# Patient Record
Sex: Female | Born: 1980 | Race: White | Hispanic: No | Marital: Married | Smoking: Never smoker
Health system: Southern US, Community
[De-identification: ages and names within clinical notes are randomized; demographics above are authoritative.]

## PROBLEM LIST (undated history)

## (undated) DIAGNOSIS — K589 Irritable bowel syndrome without diarrhea: Secondary | ICD-10-CM

## (undated) DIAGNOSIS — F4310 Post-traumatic stress disorder, unspecified: Secondary | ICD-10-CM

## (undated) DIAGNOSIS — F419 Anxiety disorder, unspecified: Secondary | ICD-10-CM

## (undated) DIAGNOSIS — J45909 Unspecified asthma, uncomplicated: Secondary | ICD-10-CM

## (undated) HISTORY — DX: Anxiety disorder, unspecified: F41.9

## (undated) HISTORY — DX: Post-traumatic stress disorder, unspecified: F43.10

## (undated) HISTORY — PX: TONSILLECTOMY: SUR1361

## (undated) HISTORY — DX: Irritable bowel syndrome, unspecified: K58.9

## (undated) HISTORY — PX: KNEE SURGERY: SHX244

## (undated) HISTORY — PX: BREAST SURGERY: SHX581

---

## 2013-03-29 ENCOUNTER — Emergency Department (HOSPITAL_COMMUNITY)
Admission: EM | Admit: 2013-03-29 | Discharge: 2013-03-29 | Disposition: A | Discharge status: Home / Self Care | Payer: BC Managed Care – PPO | Attending: Emergency Medicine | Admitting: Emergency Medicine

## 2013-03-29 ENCOUNTER — Encounter (HOSPITAL_COMMUNITY): Payer: Self-pay | Admitting: Emergency Medicine

## 2013-03-29 DIAGNOSIS — Z23 Encounter for immunization: Secondary | ICD-10-CM | POA: Insufficient documentation

## 2013-03-29 DIAGNOSIS — J45909 Unspecified asthma, uncomplicated: Secondary | ICD-10-CM | POA: Insufficient documentation

## 2013-03-29 DIAGNOSIS — Z203 Contact with and (suspected) exposure to rabies: Secondary | ICD-10-CM

## 2013-03-29 DIAGNOSIS — Z79899 Other long term (current) drug therapy: Secondary | ICD-10-CM | POA: Insufficient documentation

## 2013-03-29 HISTORY — DX: Unspecified asthma, uncomplicated: J45.909

## 2013-03-29 MED ORDER — RABIES VACCINE, PCEC IM SUSR
1.0000 mL | Freq: Once | INTRAMUSCULAR | Status: AC
  Administered 2013-03-29: 1 mL via INTRAMUSCULAR
  Filled 2013-03-29: qty 1

## 2013-03-29 MED ORDER — RABIES IMMUNE GLOBULIN 150 UNIT/ML IM INJ
20.0000 [IU]/kg | INJECTION | Freq: Once | INTRAMUSCULAR | Status: AC
  Administered 2013-03-29: 1875 [IU] via INTRAMUSCULAR
  Filled 2013-03-29: qty 12.5

## 2013-03-29 NOTE — ED Provider Notes (Signed)
CSN: [id number]     Arrival date & time 03/29/13  1850 History  This chart was scribed for non-physician practitioner, Dierdre Forth, PA-C,working with Glynn Octave, MD, by Karle Plumber, ED Scribe.  This patient was seen in room TR06C/TR06C and the patient's care was started at 8:01 PM.  Chief Complaint  Patient presents with  . Rabies Injection   The history is provided by the patient and medical records. No language interpreter was used.   HPI Comments:  Cassandra Baxter is a 32 y.o. female who presents to the Emergency Department complaining of being exposed to a bat. Pt states she went to visit her parents and is 99% sure there was a bat in the house. Pt states she was not bitten and did not touch the bat. Pt reports that she is going out of the country traveling to [place] from 04/08/13-04/18/13 and is concerned about being in town for the remaining vaccinations. Pt denies confusion, seizures, abdominal pain or fever.   Past Medical History  Diagnosis Date  . Asthma    Past Surgical History  Procedure Laterality Date  . Knee surgery     No family history on file. History  Substance Use Topics  . Smoking status: Never Smoker   . Smokeless tobacco: Not on file  . Alcohol Use: No   OB History   Grav Para Term Preterm Abortions TAB SAB Ect Mult Living                 Review of Systems  Constitutional: Negative for fever, diaphoresis, appetite change, fatigue and unexpected weight change.  HENT: Negative for mouth sores.   Eyes: Negative for visual disturbance.  Respiratory: Negative for cough, chest tightness, shortness of breath and wheezing.   Cardiovascular: Negative for chest pain.  Gastrointestinal: Negative for nausea, vomiting, abdominal pain, diarrhea and constipation.  Endocrine: Negative for polydipsia, polyphagia and polyuria.  Genitourinary: Negative for dysuria, urgency, frequency and hematuria.  Musculoskeletal: Negative for back pain and neck  stiffness.  Skin: Negative for rash.  Allergic/Immunologic: Negative for immunocompromised state.  Neurological: Negative for syncope, light-headedness and headaches.  Hematological: Does not bruise/bleed easily.  Psychiatric/Behavioral: Negative for sleep disturbance. The patient is not nervous/anxious.   All other systems reviewed and are negative.    Allergies  Review of patient's allergies indicates no known allergies.  Home Medications   Current Outpatient Rx  Name  Route  Sig  Dispense  Refill  . acetaminophen (TYLENOL) 500 MG tablet   Oral   Take 1,000 mg by mouth 3 (three) times daily as needed (pain).         Marland Kitchen albuterol (PROVENTIL HFA;VENTOLIN HFA) 108 (90 BASE) MCG/ACT inhaler   Inhalation   Inhale 2 puffs into the lungs daily as needed (asthma symptoms).         . ALPRAZolam (XANAX) 0.5 MG tablet   Oral   Take 0.5 mg by mouth at bedtime.         Marland Kitchen CALCIUM PO   Oral   Take 1 tablet by mouth daily.         . celecoxib (CELEBREX) 100 MG capsule   Oral   Take 100 mg by mouth daily.         . Cyanocobalamin (VITAMIN B-12 PO)   Oral   Take 1 tablet by mouth daily.         Marland Kitchen FOLIC ACID PO   Oral   Take 1 tablet by mouth daily.         Marland Kitchen  Glucosamine HCl (GLUCOSAMINE PO)   Oral   Take 1 tablet by mouth daily.         Marland Kitchen MAGNESIUM PO   Oral   Take 1 tablet by mouth daily. otc         . norethindrone-ethinyl estradiol (JUNEL FE,GILDESS FE,LOESTRIN FE) 1-20 MG-MCG tablet   Oral   Take 1 tablet by mouth daily.         Marland Kitchen POTASSIUM PO   Oral   Take 1 tablet by mouth daily. otc         . PRESCRIPTION MEDICATION   Topical   Apply 1 application topically 3 (three) times daily as needed (pain). Cream compounded at [facility] [id number]):  Ketoprofen 15%, baclofen 2%, cyclobenzaprine 2%, gabapentin 10%, lidocaine 2%         . sertraline (ZOLOFT) 50 MG tablet   Oral   Take 150 mg by mouth at bedtime.         . traMADol  (ULTRAM) 50 MG tablet   Oral   Take 50 mg by mouth daily as needed (pain).          . TURMERIC CURCUMIN PO   Oral   Take 1 capsule by mouth daily.          Triage Vitals: BP 128/72  Pulse 63  Temp(Src) 97.6 F (36.4 C) (Oral)  Resp 20  SpO2 100%  LMP 03/08/2013 Physical Exam  Nursing note and vitals reviewed. Constitutional: She is oriented to person, place, and time. She appears well-developed and well-nourished. No distress.  Awake, alert, nontoxic appearance  HENT:  Head: Normocephalic and atraumatic.  Mouth/Throat: Oropharynx is clear and moist. No oropharyngeal exudate.  Eyes: Conjunctivae are normal. No scleral icterus.  Neck: Normal range of motion. Neck supple.  Cardiovascular: Normal rate, regular rhythm, normal heart sounds and intact distal pulses.   No murmur heard. Pulmonary/Chest: Effort normal and breath sounds normal. No respiratory distress. She has no wheezes.  Abdominal: Soft. Bowel sounds are normal. She exhibits no mass. There is no tenderness. There is no rebound and no guarding.  Musculoskeletal: Normal range of motion. She exhibits no edema.  Neurological: She is alert and oriented to person, place, and time. No cranial nerve deficit. She exhibits normal muscle tone. Coordination normal.  Speech is clear and goal oriented Moves extremities without ataxia  Skin: Skin is warm and dry. No rash noted. She is not diaphoretic. No erythema.  Psychiatric: She has a normal mood and affect. Her behavior is normal.    ED Course  Procedures (including critical care time) DIAGNOSTIC STUDIES: Oxygen Saturation is 100% on RA, normal by my interpretation.   COORDINATION OF CARE: 8:15 PM- Will administer rabies vaccine and immunoglobulin prior to discharge. Pt verbalizes understanding and agrees to plan.  Medications  rabies vaccine (RABAVERT) injection 1 mL (not administered)  rabies immune globulin (HYPERAB) injection 1,875 Units (not administered)     Labs Review Labs Reviewed - No data to display Imaging Review No results found.  EKG Interpretation   None       MDM   1. Need for post exposure prophylaxis for rabies      [place] presents with concerns about a possible rabies exposure.  Pt is at very low risk for contracting rabies, but the risk vs benefit of the vaccine and immunoglobulin have been discussed with the patient.  They are electing to proceed with the administration of the vaccine and the immunoglobulin.  Pt  is hemodynamically stable and neurovascularly intact. No concern for acute rabies at this time.  Him in followup with [place] urgent care for further administrations of immunoglobulin.  It has been determined that no acute conditions requiring further emergency intervention are present at this time. The patient/guardian have been advised of the diagnosis and plan. We have discussed signs and symptoms that warrant return to the ED, such as changes or worsening in symptoms.   Vital signs are stable at discharge.   BP 128/72  Pulse 63  Temp(Src) 97.6 F (36.4 C) (Oral)  Resp 20  Wt 208 lb (94.348 kg)  SpO2 100%  LMP 03/08/2013  Patient/guardian has voiced understanding and agreed to follow-up with the PCP or specialist.   I personally performed the services described in this documentation, which was scribed in my presence. The recorded information has been reviewed and is accurate.    Dahlia Client Nakema Fake, PA-C 03/29/13 2129  Dierdre Forth, PA-C 03/29/13 2130

## 2013-03-29 NOTE — ED Notes (Signed)
Pt st's she was at her mothers house on Thanksgiving and there was a bat in the house.  St's after seeing it they could not locate it again but was told to come and get Rabies injection

## 2013-03-30 NOTE — ED Provider Notes (Signed)
Medical screening examination/treatment/procedure(s) were performed by non-physician practitioner and as supervising physician I was immediately available for consultation/collaboration.  EKG Interpretation   None         [name], MD 03/30/13 0104 

## 2013-04-01 ENCOUNTER — Emergency Department (INDEPENDENT_AMBULATORY_CARE_PROVIDER_SITE_OTHER)
Admission: EM | Admit: 2013-04-01 | Discharge: 2013-04-01 | Disposition: A | Discharge status: Home / Self Care | Payer: BC Managed Care – PPO | Source: Home / Self Care

## 2013-04-01 ENCOUNTER — Encounter (HOSPITAL_COMMUNITY): Payer: Self-pay | Admitting: Emergency Medicine

## 2013-04-01 ENCOUNTER — Telehealth (HOSPITAL_COMMUNITY): Payer: Self-pay

## 2013-04-01 DIAGNOSIS — Z203 Contact with and (suspected) exposure to rabies: Secondary | ICD-10-CM

## 2013-04-01 MED ORDER — RABIES VACCINE, PCEC IM SUSR
1.0000 mL | Freq: Once | INTRAMUSCULAR | Status: AC
  Administered 2013-04-01: 1 mL via INTRAMUSCULAR

## 2013-04-01 MED ORDER — RABIES VACCINE, PCEC IM SUSR
INTRAMUSCULAR | Status: AC
  Filled 2013-04-01: qty 1

## 2013-04-01 NOTE — ED Notes (Signed)
Patient in department for rabies injection 

## 2013-04-05 ENCOUNTER — Emergency Department (INDEPENDENT_AMBULATORY_CARE_PROVIDER_SITE_OTHER)
Admission: EM | Admit: 2013-04-05 | Discharge: 2013-04-05 | Disposition: A | Discharge status: Home / Self Care | Payer: BC Managed Care – PPO | Source: Home / Self Care

## 2013-04-05 ENCOUNTER — Encounter (HOSPITAL_COMMUNITY): Payer: Self-pay | Admitting: Emergency Medicine

## 2013-04-05 DIAGNOSIS — Z203 Contact with and (suspected) exposure to rabies: Secondary | ICD-10-CM

## 2013-04-05 MED ORDER — RABIES VACCINE, PCEC IM SUSR
INTRAMUSCULAR | Status: AC
  Filled 2013-04-05: qty 1

## 2013-04-05 MED ORDER — RABIES VACCINE, PCEC IM SUSR
1.0000 mL | Freq: Once | INTRAMUSCULAR | Status: AC
  Administered 2013-04-05: 1 mL via INTRAMUSCULAR

## 2013-04-05 NOTE — ED Notes (Signed)
Pt  Here  For  The  Next  Series  Of  Rabies  Injections  Voices  No   Complaints

## 2013-04-05 NOTE — ED Notes (Signed)
Pt   Cassandra Baxter  Be  In [place]  When the  Next     Of  His  Rabies   Injections  Are  Due            Dr  Denyse Amass  Advised          And      Advised  To  Get the  Last  When  She  Gets  Back

## 2013-04-19 ENCOUNTER — Encounter (HOSPITAL_COMMUNITY): Payer: Self-pay | Admitting: Emergency Medicine

## 2013-04-19 ENCOUNTER — Emergency Department (INDEPENDENT_AMBULATORY_CARE_PROVIDER_SITE_OTHER)
Admission: EM | Admit: 2013-04-19 | Discharge: 2013-04-19 | Disposition: A | Discharge status: Home / Self Care | Payer: BC Managed Care – PPO | Source: Home / Self Care

## 2013-04-19 DIAGNOSIS — Z203 Contact with and (suspected) exposure to rabies: Secondary | ICD-10-CM

## 2013-04-19 MED ORDER — RABIES VACCINE, PCEC IM SUSR
1.0000 mL | Freq: Once | INTRAMUSCULAR | Status: AC
  Administered 2013-04-19: 1 mL via INTRAMUSCULAR

## 2013-04-19 MED ORDER — RABIES VACCINE, PCEC IM SUSR
INTRAMUSCULAR | Status: AC
  Filled 2013-04-19: qty 1

## 2013-04-19 NOTE — ED Notes (Signed)
Here for final shot in series

## 2015-07-30 DIAGNOSIS — J3081 Allergic rhinitis due to animal (cat) (dog) hair and dander: Secondary | ICD-10-CM | POA: Diagnosis not present

## 2015-07-30 DIAGNOSIS — J301 Allergic rhinitis due to pollen: Secondary | ICD-10-CM | POA: Diagnosis not present

## 2015-07-30 DIAGNOSIS — J302 Other seasonal allergic rhinitis: Secondary | ICD-10-CM | POA: Diagnosis not present

## 2015-07-30 DIAGNOSIS — J4520 Mild intermittent asthma, uncomplicated: Secondary | ICD-10-CM | POA: Diagnosis not present

## 2015-08-01 DIAGNOSIS — F410 Panic disorder [episodic paroxysmal anxiety] without agoraphobia: Secondary | ICD-10-CM | POA: Diagnosis not present

## 2015-08-06 DIAGNOSIS — F4310 Post-traumatic stress disorder, unspecified: Secondary | ICD-10-CM | POA: Diagnosis not present

## 2015-08-07 DIAGNOSIS — J302 Other seasonal allergic rhinitis: Secondary | ICD-10-CM | POA: Diagnosis not present

## 2015-08-07 DIAGNOSIS — J3081 Allergic rhinitis due to animal (cat) (dog) hair and dander: Secondary | ICD-10-CM | POA: Diagnosis not present

## 2015-08-07 DIAGNOSIS — J4520 Mild intermittent asthma, uncomplicated: Secondary | ICD-10-CM | POA: Diagnosis not present

## 2015-08-07 DIAGNOSIS — J301 Allergic rhinitis due to pollen: Secondary | ICD-10-CM | POA: Diagnosis not present

## 2015-08-09 DIAGNOSIS — F410 Panic disorder [episodic paroxysmal anxiety] without agoraphobia: Secondary | ICD-10-CM | POA: Diagnosis not present

## 2015-08-13 DIAGNOSIS — J4520 Mild intermittent asthma, uncomplicated: Secondary | ICD-10-CM | POA: Diagnosis not present

## 2015-08-13 DIAGNOSIS — J3081 Allergic rhinitis due to animal (cat) (dog) hair and dander: Secondary | ICD-10-CM | POA: Diagnosis not present

## 2015-08-13 DIAGNOSIS — J301 Allergic rhinitis due to pollen: Secondary | ICD-10-CM | POA: Diagnosis not present

## 2015-08-13 DIAGNOSIS — J302 Other seasonal allergic rhinitis: Secondary | ICD-10-CM | POA: Diagnosis not present

## 2015-08-15 DIAGNOSIS — M6281 Muscle weakness (generalized): Secondary | ICD-10-CM | POA: Diagnosis not present

## 2015-08-15 DIAGNOSIS — M545 Low back pain: Secondary | ICD-10-CM | POA: Diagnosis not present

## 2015-08-15 DIAGNOSIS — M25571 Pain in right ankle and joints of right foot: Secondary | ICD-10-CM | POA: Diagnosis not present

## 2015-08-17 DIAGNOSIS — F410 Panic disorder [episodic paroxysmal anxiety] without agoraphobia: Secondary | ICD-10-CM | POA: Diagnosis not present

## 2015-08-17 DIAGNOSIS — Z8349 Family history of other endocrine, nutritional and metabolic diseases: Secondary | ICD-10-CM | POA: Diagnosis not present

## 2015-08-17 DIAGNOSIS — Z01419 Encounter for gynecological examination (general) (routine) without abnormal findings: Secondary | ICD-10-CM | POA: Diagnosis not present

## 2015-08-17 DIAGNOSIS — F4310 Post-traumatic stress disorder, unspecified: Secondary | ICD-10-CM | POA: Diagnosis not present

## 2015-08-20 DIAGNOSIS — J302 Other seasonal allergic rhinitis: Secondary | ICD-10-CM | POA: Diagnosis not present

## 2015-08-20 DIAGNOSIS — J301 Allergic rhinitis due to pollen: Secondary | ICD-10-CM | POA: Diagnosis not present

## 2015-08-20 DIAGNOSIS — J4520 Mild intermittent asthma, uncomplicated: Secondary | ICD-10-CM | POA: Diagnosis not present

## 2015-08-20 DIAGNOSIS — J3081 Allergic rhinitis due to animal (cat) (dog) hair and dander: Secondary | ICD-10-CM | POA: Diagnosis not present

## 2015-08-21 DIAGNOSIS — F410 Panic disorder [episodic paroxysmal anxiety] without agoraphobia: Secondary | ICD-10-CM | POA: Diagnosis not present

## 2015-08-22 DIAGNOSIS — M545 Low back pain: Secondary | ICD-10-CM | POA: Diagnosis not present

## 2015-08-22 DIAGNOSIS — M25571 Pain in right ankle and joints of right foot: Secondary | ICD-10-CM | POA: Diagnosis not present

## 2015-08-22 DIAGNOSIS — M6281 Muscle weakness (generalized): Secondary | ICD-10-CM | POA: Diagnosis not present

## 2015-08-27 DIAGNOSIS — F410 Panic disorder [episodic paroxysmal anxiety] without agoraphobia: Secondary | ICD-10-CM | POA: Diagnosis not present

## 2015-08-28 DIAGNOSIS — J301 Allergic rhinitis due to pollen: Secondary | ICD-10-CM | POA: Diagnosis not present

## 2015-08-28 DIAGNOSIS — J4520 Mild intermittent asthma, uncomplicated: Secondary | ICD-10-CM | POA: Diagnosis not present

## 2015-08-28 DIAGNOSIS — J302 Other seasonal allergic rhinitis: Secondary | ICD-10-CM | POA: Diagnosis not present

## 2015-08-28 DIAGNOSIS — J3081 Allergic rhinitis due to animal (cat) (dog) hair and dander: Secondary | ICD-10-CM | POA: Diagnosis not present

## 2015-08-29 DIAGNOSIS — M25511 Pain in right shoulder: Secondary | ICD-10-CM | POA: Diagnosis not present

## 2015-08-30 DIAGNOSIS — M25571 Pain in right ankle and joints of right foot: Secondary | ICD-10-CM | POA: Diagnosis not present

## 2015-08-30 DIAGNOSIS — M545 Low back pain: Secondary | ICD-10-CM | POA: Diagnosis not present

## 2015-08-30 DIAGNOSIS — M5136 Other intervertebral disc degeneration, lumbar region: Secondary | ICD-10-CM | POA: Diagnosis not present

## 2015-08-30 DIAGNOSIS — M47816 Spondylosis without myelopathy or radiculopathy, lumbar region: Secondary | ICD-10-CM | POA: Diagnosis not present

## 2015-08-30 DIAGNOSIS — M6281 Muscle weakness (generalized): Secondary | ICD-10-CM | POA: Diagnosis not present

## 2015-08-31 DIAGNOSIS — F4310 Post-traumatic stress disorder, unspecified: Secondary | ICD-10-CM | POA: Diagnosis not present

## 2015-09-03 DIAGNOSIS — F410 Panic disorder [episodic paroxysmal anxiety] without agoraphobia: Secondary | ICD-10-CM | POA: Diagnosis not present

## 2015-09-04 DIAGNOSIS — J301 Allergic rhinitis due to pollen: Secondary | ICD-10-CM | POA: Diagnosis not present

## 2015-09-04 DIAGNOSIS — J302 Other seasonal allergic rhinitis: Secondary | ICD-10-CM | POA: Diagnosis not present

## 2015-09-04 DIAGNOSIS — J4520 Mild intermittent asthma, uncomplicated: Secondary | ICD-10-CM | POA: Diagnosis not present

## 2015-09-04 DIAGNOSIS — J3081 Allergic rhinitis due to animal (cat) (dog) hair and dander: Secondary | ICD-10-CM | POA: Diagnosis not present

## 2015-09-06 DIAGNOSIS — M25571 Pain in right ankle and joints of right foot: Secondary | ICD-10-CM | POA: Diagnosis not present

## 2015-09-06 DIAGNOSIS — M545 Low back pain: Secondary | ICD-10-CM | POA: Diagnosis not present

## 2015-09-06 DIAGNOSIS — M6281 Muscle weakness (generalized): Secondary | ICD-10-CM | POA: Diagnosis not present

## 2015-09-10 DIAGNOSIS — F410 Panic disorder [episodic paroxysmal anxiety] without agoraphobia: Secondary | ICD-10-CM | POA: Diagnosis not present

## 2015-09-10 DIAGNOSIS — J3081 Allergic rhinitis due to animal (cat) (dog) hair and dander: Secondary | ICD-10-CM | POA: Diagnosis not present

## 2015-09-10 DIAGNOSIS — J302 Other seasonal allergic rhinitis: Secondary | ICD-10-CM | POA: Diagnosis not present

## 2015-09-10 DIAGNOSIS — J301 Allergic rhinitis due to pollen: Secondary | ICD-10-CM | POA: Diagnosis not present

## 2015-09-10 DIAGNOSIS — J4520 Mild intermittent asthma, uncomplicated: Secondary | ICD-10-CM | POA: Diagnosis not present

## 2015-09-11 DIAGNOSIS — Z8739 Personal history of other diseases of the musculoskeletal system and connective tissue: Secondary | ICD-10-CM | POA: Diagnosis not present

## 2015-09-11 DIAGNOSIS — Z317 Encounter for procreative management and counseling for gestational carrier: Secondary | ICD-10-CM | POA: Diagnosis not present

## 2015-09-17 DIAGNOSIS — J3081 Allergic rhinitis due to animal (cat) (dog) hair and dander: Secondary | ICD-10-CM | POA: Diagnosis not present

## 2015-09-17 DIAGNOSIS — J302 Other seasonal allergic rhinitis: Secondary | ICD-10-CM | POA: Diagnosis not present

## 2015-09-17 DIAGNOSIS — J4520 Mild intermittent asthma, uncomplicated: Secondary | ICD-10-CM | POA: Diagnosis not present

## 2015-09-17 DIAGNOSIS — J301 Allergic rhinitis due to pollen: Secondary | ICD-10-CM | POA: Diagnosis not present

## 2015-09-19 DIAGNOSIS — M6281 Muscle weakness (generalized): Secondary | ICD-10-CM | POA: Diagnosis not present

## 2015-09-19 DIAGNOSIS — M545 Low back pain: Secondary | ICD-10-CM | POA: Diagnosis not present

## 2015-09-19 DIAGNOSIS — M25571 Pain in right ankle and joints of right foot: Secondary | ICD-10-CM | POA: Diagnosis not present

## 2015-09-21 DIAGNOSIS — F410 Panic disorder [episodic paroxysmal anxiety] without agoraphobia: Secondary | ICD-10-CM | POA: Diagnosis not present

## 2015-09-25 DIAGNOSIS — J3081 Allergic rhinitis due to animal (cat) (dog) hair and dander: Secondary | ICD-10-CM | POA: Diagnosis not present

## 2015-09-25 DIAGNOSIS — L821 Other seborrheic keratosis: Secondary | ICD-10-CM | POA: Diagnosis not present

## 2015-09-25 DIAGNOSIS — D239 Other benign neoplasm of skin, unspecified: Secondary | ICD-10-CM | POA: Diagnosis not present

## 2015-09-25 DIAGNOSIS — J4520 Mild intermittent asthma, uncomplicated: Secondary | ICD-10-CM | POA: Diagnosis not present

## 2015-09-25 DIAGNOSIS — J302 Other seasonal allergic rhinitis: Secondary | ICD-10-CM | POA: Diagnosis not present

## 2015-09-25 DIAGNOSIS — J301 Allergic rhinitis due to pollen: Secondary | ICD-10-CM | POA: Diagnosis not present

## 2015-09-26 DIAGNOSIS — F410 Panic disorder [episodic paroxysmal anxiety] without agoraphobia: Secondary | ICD-10-CM | POA: Diagnosis not present

## 2015-10-01 DIAGNOSIS — J302 Other seasonal allergic rhinitis: Secondary | ICD-10-CM | POA: Diagnosis not present

## 2015-10-01 DIAGNOSIS — J3081 Allergic rhinitis due to animal (cat) (dog) hair and dander: Secondary | ICD-10-CM | POA: Diagnosis not present

## 2015-10-01 DIAGNOSIS — F410 Panic disorder [episodic paroxysmal anxiety] without agoraphobia: Secondary | ICD-10-CM | POA: Diagnosis not present

## 2015-10-01 DIAGNOSIS — J301 Allergic rhinitis due to pollen: Secondary | ICD-10-CM | POA: Diagnosis not present

## 2015-10-01 DIAGNOSIS — J4520 Mild intermittent asthma, uncomplicated: Secondary | ICD-10-CM | POA: Diagnosis not present

## 2015-10-04 DIAGNOSIS — F4310 Post-traumatic stress disorder, unspecified: Secondary | ICD-10-CM | POA: Diagnosis not present

## 2015-10-15 DIAGNOSIS — M25511 Pain in right shoulder: Secondary | ICD-10-CM | POA: Diagnosis not present

## 2015-10-15 DIAGNOSIS — J301 Allergic rhinitis due to pollen: Secondary | ICD-10-CM | POA: Diagnosis not present

## 2015-10-15 DIAGNOSIS — J302 Other seasonal allergic rhinitis: Secondary | ICD-10-CM | POA: Diagnosis not present

## 2015-10-15 DIAGNOSIS — J4520 Mild intermittent asthma, uncomplicated: Secondary | ICD-10-CM | POA: Diagnosis not present

## 2015-10-15 DIAGNOSIS — J3081 Allergic rhinitis due to animal (cat) (dog) hair and dander: Secondary | ICD-10-CM | POA: Diagnosis not present

## 2015-10-15 DIAGNOSIS — M6281 Muscle weakness (generalized): Secondary | ICD-10-CM | POA: Diagnosis not present

## 2015-10-17 DIAGNOSIS — F410 Panic disorder [episodic paroxysmal anxiety] without agoraphobia: Secondary | ICD-10-CM | POA: Diagnosis not present

## 2015-10-19 DIAGNOSIS — S0086XA Insect bite (nonvenomous) of other part of head, initial encounter: Secondary | ICD-10-CM | POA: Diagnosis not present

## 2015-10-19 DIAGNOSIS — W57XXXA Bitten or stung by nonvenomous insect and other nonvenomous arthropods, initial encounter: Secondary | ICD-10-CM | POA: Diagnosis not present

## 2015-11-02 DIAGNOSIS — F410 Panic disorder [episodic paroxysmal anxiety] without agoraphobia: Secondary | ICD-10-CM | POA: Diagnosis not present

## 2015-11-07 DIAGNOSIS — M545 Low back pain: Secondary | ICD-10-CM | POA: Diagnosis not present

## 2015-11-07 DIAGNOSIS — M5136 Other intervertebral disc degeneration, lumbar region: Secondary | ICD-10-CM | POA: Diagnosis not present

## 2015-11-08 DIAGNOSIS — M6281 Muscle weakness (generalized): Secondary | ICD-10-CM | POA: Diagnosis not present

## 2015-11-08 DIAGNOSIS — M25511 Pain in right shoulder: Secondary | ICD-10-CM | POA: Diagnosis not present

## 2015-11-09 DIAGNOSIS — F410 Panic disorder [episodic paroxysmal anxiety] without agoraphobia: Secondary | ICD-10-CM | POA: Diagnosis not present

## 2015-11-12 DIAGNOSIS — M25511 Pain in right shoulder: Secondary | ICD-10-CM | POA: Diagnosis not present

## 2015-11-12 DIAGNOSIS — M6281 Muscle weakness (generalized): Secondary | ICD-10-CM | POA: Diagnosis not present

## 2015-11-13 DIAGNOSIS — J4520 Mild intermittent asthma, uncomplicated: Secondary | ICD-10-CM | POA: Diagnosis not present

## 2015-11-13 DIAGNOSIS — J3081 Allergic rhinitis due to animal (cat) (dog) hair and dander: Secondary | ICD-10-CM | POA: Diagnosis not present

## 2015-11-13 DIAGNOSIS — J302 Other seasonal allergic rhinitis: Secondary | ICD-10-CM | POA: Diagnosis not present

## 2015-11-13 DIAGNOSIS — J301 Allergic rhinitis due to pollen: Secondary | ICD-10-CM | POA: Diagnosis not present

## 2015-11-16 DIAGNOSIS — F410 Panic disorder [episodic paroxysmal anxiety] without agoraphobia: Secondary | ICD-10-CM | POA: Diagnosis not present

## 2015-11-26 DIAGNOSIS — F410 Panic disorder [episodic paroxysmal anxiety] without agoraphobia: Secondary | ICD-10-CM | POA: Diagnosis not present

## 2015-11-27 DIAGNOSIS — F4310 Post-traumatic stress disorder, unspecified: Secondary | ICD-10-CM | POA: Diagnosis not present

## 2015-11-29 DIAGNOSIS — M25511 Pain in right shoulder: Secondary | ICD-10-CM | POA: Diagnosis not present

## 2015-11-29 DIAGNOSIS — M6281 Muscle weakness (generalized): Secondary | ICD-10-CM | POA: Diagnosis not present

## 2015-12-03 DIAGNOSIS — F410 Panic disorder [episodic paroxysmal anxiety] without agoraphobia: Secondary | ICD-10-CM | POA: Diagnosis not present

## 2015-12-04 DIAGNOSIS — J301 Allergic rhinitis due to pollen: Secondary | ICD-10-CM | POA: Diagnosis not present

## 2015-12-04 DIAGNOSIS — J4520 Mild intermittent asthma, uncomplicated: Secondary | ICD-10-CM | POA: Diagnosis not present

## 2015-12-04 DIAGNOSIS — J302 Other seasonal allergic rhinitis: Secondary | ICD-10-CM | POA: Diagnosis not present

## 2015-12-04 DIAGNOSIS — J3081 Allergic rhinitis due to animal (cat) (dog) hair and dander: Secondary | ICD-10-CM | POA: Diagnosis not present

## 2015-12-10 DIAGNOSIS — R942 Abnormal results of pulmonary function studies: Secondary | ICD-10-CM | POA: Diagnosis not present

## 2015-12-10 DIAGNOSIS — J301 Allergic rhinitis due to pollen: Secondary | ICD-10-CM | POA: Diagnosis not present

## 2015-12-10 DIAGNOSIS — J302 Other seasonal allergic rhinitis: Secondary | ICD-10-CM | POA: Diagnosis not present

## 2015-12-10 DIAGNOSIS — F410 Panic disorder [episodic paroxysmal anxiety] without agoraphobia: Secondary | ICD-10-CM | POA: Diagnosis not present

## 2015-12-10 DIAGNOSIS — J4520 Mild intermittent asthma, uncomplicated: Secondary | ICD-10-CM | POA: Diagnosis not present

## 2015-12-10 DIAGNOSIS — J3081 Allergic rhinitis due to animal (cat) (dog) hair and dander: Secondary | ICD-10-CM | POA: Diagnosis not present

## 2015-12-10 DIAGNOSIS — J4530 Mild persistent asthma, uncomplicated: Secondary | ICD-10-CM | POA: Diagnosis not present

## 2015-12-12 DIAGNOSIS — Z136 Encounter for screening for cardiovascular disorders: Secondary | ICD-10-CM | POA: Diagnosis not present

## 2015-12-12 DIAGNOSIS — Z23 Encounter for immunization: Secondary | ICD-10-CM | POA: Diagnosis not present

## 2015-12-12 DIAGNOSIS — Z0000 Encounter for general adult medical examination without abnormal findings: Secondary | ICD-10-CM | POA: Diagnosis not present

## 2015-12-13 DIAGNOSIS — M25511 Pain in right shoulder: Secondary | ICD-10-CM | POA: Diagnosis not present

## 2015-12-13 DIAGNOSIS — M6281 Muscle weakness (generalized): Secondary | ICD-10-CM | POA: Diagnosis not present

## 2015-12-17 DIAGNOSIS — F410 Panic disorder [episodic paroxysmal anxiety] without agoraphobia: Secondary | ICD-10-CM | POA: Diagnosis not present

## 2015-12-18 DIAGNOSIS — M5136 Other intervertebral disc degeneration, lumbar region: Secondary | ICD-10-CM | POA: Diagnosis not present

## 2015-12-18 DIAGNOSIS — M25511 Pain in right shoulder: Secondary | ICD-10-CM | POA: Diagnosis not present

## 2015-12-19 DIAGNOSIS — M6281 Muscle weakness (generalized): Secondary | ICD-10-CM | POA: Diagnosis not present

## 2015-12-19 DIAGNOSIS — M25511 Pain in right shoulder: Secondary | ICD-10-CM | POA: Diagnosis not present

## 2015-12-24 DIAGNOSIS — F410 Panic disorder [episodic paroxysmal anxiety] without agoraphobia: Secondary | ICD-10-CM | POA: Diagnosis not present

## 2015-12-25 DIAGNOSIS — F4310 Post-traumatic stress disorder, unspecified: Secondary | ICD-10-CM | POA: Diagnosis not present

## 2015-12-26 DIAGNOSIS — M25511 Pain in right shoulder: Secondary | ICD-10-CM | POA: Diagnosis not present

## 2015-12-27 DIAGNOSIS — M25511 Pain in right shoulder: Secondary | ICD-10-CM | POA: Diagnosis not present

## 2015-12-27 DIAGNOSIS — J3081 Allergic rhinitis due to animal (cat) (dog) hair and dander: Secondary | ICD-10-CM | POA: Diagnosis not present

## 2015-12-27 DIAGNOSIS — J301 Allergic rhinitis due to pollen: Secondary | ICD-10-CM | POA: Diagnosis not present

## 2015-12-27 DIAGNOSIS — J4520 Mild intermittent asthma, uncomplicated: Secondary | ICD-10-CM | POA: Diagnosis not present

## 2015-12-27 DIAGNOSIS — J302 Other seasonal allergic rhinitis: Secondary | ICD-10-CM | POA: Diagnosis not present

## 2015-12-27 DIAGNOSIS — M6281 Muscle weakness (generalized): Secondary | ICD-10-CM | POA: Diagnosis not present

## 2016-01-01 DIAGNOSIS — J301 Allergic rhinitis due to pollen: Secondary | ICD-10-CM | POA: Diagnosis not present

## 2016-01-01 DIAGNOSIS — F410 Panic disorder [episodic paroxysmal anxiety] without agoraphobia: Secondary | ICD-10-CM | POA: Diagnosis not present

## 2016-01-01 DIAGNOSIS — J302 Other seasonal allergic rhinitis: Secondary | ICD-10-CM | POA: Diagnosis not present

## 2016-01-01 DIAGNOSIS — J3081 Allergic rhinitis due to animal (cat) (dog) hair and dander: Secondary | ICD-10-CM | POA: Diagnosis not present

## 2016-01-01 DIAGNOSIS — J4520 Mild intermittent asthma, uncomplicated: Secondary | ICD-10-CM | POA: Diagnosis not present

## 2016-01-04 DIAGNOSIS — M25511 Pain in right shoulder: Secondary | ICD-10-CM | POA: Diagnosis not present

## 2016-01-04 DIAGNOSIS — M6281 Muscle weakness (generalized): Secondary | ICD-10-CM | POA: Diagnosis not present

## 2016-01-07 DIAGNOSIS — F410 Panic disorder [episodic paroxysmal anxiety] without agoraphobia: Secondary | ICD-10-CM | POA: Diagnosis not present

## 2016-01-08 DIAGNOSIS — J4520 Mild intermittent asthma, uncomplicated: Secondary | ICD-10-CM | POA: Diagnosis not present

## 2016-01-08 DIAGNOSIS — J302 Other seasonal allergic rhinitis: Secondary | ICD-10-CM | POA: Diagnosis not present

## 2016-01-08 DIAGNOSIS — J3081 Allergic rhinitis due to animal (cat) (dog) hair and dander: Secondary | ICD-10-CM | POA: Diagnosis not present

## 2016-01-08 DIAGNOSIS — J301 Allergic rhinitis due to pollen: Secondary | ICD-10-CM | POA: Diagnosis not present

## 2016-01-10 DIAGNOSIS — M6281 Muscle weakness (generalized): Secondary | ICD-10-CM | POA: Diagnosis not present

## 2016-01-10 DIAGNOSIS — M25511 Pain in right shoulder: Secondary | ICD-10-CM | POA: Diagnosis not present

## 2016-01-14 DIAGNOSIS — F410 Panic disorder [episodic paroxysmal anxiety] without agoraphobia: Secondary | ICD-10-CM | POA: Diagnosis not present

## 2016-01-15 DIAGNOSIS — J301 Allergic rhinitis due to pollen: Secondary | ICD-10-CM | POA: Diagnosis not present

## 2016-01-15 DIAGNOSIS — J4520 Mild intermittent asthma, uncomplicated: Secondary | ICD-10-CM | POA: Diagnosis not present

## 2016-01-15 DIAGNOSIS — J302 Other seasonal allergic rhinitis: Secondary | ICD-10-CM | POA: Diagnosis not present

## 2016-01-15 DIAGNOSIS — J3081 Allergic rhinitis due to animal (cat) (dog) hair and dander: Secondary | ICD-10-CM | POA: Diagnosis not present

## 2016-01-16 DIAGNOSIS — M25511 Pain in right shoulder: Secondary | ICD-10-CM | POA: Diagnosis not present

## 2016-01-16 DIAGNOSIS — M6281 Muscle weakness (generalized): Secondary | ICD-10-CM | POA: Diagnosis not present

## 2016-01-18 DIAGNOSIS — F4310 Post-traumatic stress disorder, unspecified: Secondary | ICD-10-CM | POA: Diagnosis not present

## 2016-01-21 DIAGNOSIS — Z91018 Allergy to other foods: Secondary | ICD-10-CM | POA: Diagnosis not present

## 2016-01-21 DIAGNOSIS — F410 Panic disorder [episodic paroxysmal anxiety] without agoraphobia: Secondary | ICD-10-CM | POA: Diagnosis not present

## 2016-01-22 DIAGNOSIS — J302 Other seasonal allergic rhinitis: Secondary | ICD-10-CM | POA: Diagnosis not present

## 2016-01-22 DIAGNOSIS — J301 Allergic rhinitis due to pollen: Secondary | ICD-10-CM | POA: Diagnosis not present

## 2016-01-22 DIAGNOSIS — J4520 Mild intermittent asthma, uncomplicated: Secondary | ICD-10-CM | POA: Diagnosis not present

## 2016-01-22 DIAGNOSIS — J3081 Allergic rhinitis due to animal (cat) (dog) hair and dander: Secondary | ICD-10-CM | POA: Diagnosis not present

## 2016-01-23 DIAGNOSIS — D72818 Other decreased white blood cell count: Secondary | ICD-10-CM | POA: Diagnosis not present

## 2016-01-24 DIAGNOSIS — M6281 Muscle weakness (generalized): Secondary | ICD-10-CM | POA: Diagnosis not present

## 2016-01-24 DIAGNOSIS — M25511 Pain in right shoulder: Secondary | ICD-10-CM | POA: Diagnosis not present

## 2016-01-25 DIAGNOSIS — K219 Gastro-esophageal reflux disease without esophagitis: Secondary | ICD-10-CM | POA: Diagnosis not present

## 2016-01-25 DIAGNOSIS — F411 Generalized anxiety disorder: Secondary | ICD-10-CM | POA: Diagnosis not present

## 2016-01-25 DIAGNOSIS — F4310 Post-traumatic stress disorder, unspecified: Secondary | ICD-10-CM | POA: Diagnosis not present

## 2016-01-25 DIAGNOSIS — F329 Major depressive disorder, single episode, unspecified: Secondary | ICD-10-CM | POA: Diagnosis not present

## 2016-01-25 DIAGNOSIS — Z23 Encounter for immunization: Secondary | ICD-10-CM | POA: Diagnosis not present

## 2016-01-28 DIAGNOSIS — J301 Allergic rhinitis due to pollen: Secondary | ICD-10-CM | POA: Diagnosis not present

## 2016-01-28 DIAGNOSIS — J302 Other seasonal allergic rhinitis: Secondary | ICD-10-CM | POA: Diagnosis not present

## 2016-01-28 DIAGNOSIS — J3081 Allergic rhinitis due to animal (cat) (dog) hair and dander: Secondary | ICD-10-CM | POA: Diagnosis not present

## 2016-01-28 DIAGNOSIS — J4520 Mild intermittent asthma, uncomplicated: Secondary | ICD-10-CM | POA: Diagnosis not present

## 2016-01-28 DIAGNOSIS — M6281 Muscle weakness (generalized): Secondary | ICD-10-CM | POA: Diagnosis not present

## 2016-01-28 DIAGNOSIS — M25511 Pain in right shoulder: Secondary | ICD-10-CM | POA: Diagnosis not present

## 2016-01-30 DIAGNOSIS — F410 Panic disorder [episodic paroxysmal anxiety] without agoraphobia: Secondary | ICD-10-CM | POA: Diagnosis not present

## 2016-02-04 DIAGNOSIS — F410 Panic disorder [episodic paroxysmal anxiety] without agoraphobia: Secondary | ICD-10-CM | POA: Diagnosis not present

## 2016-02-05 DIAGNOSIS — J301 Allergic rhinitis due to pollen: Secondary | ICD-10-CM | POA: Diagnosis not present

## 2016-02-05 DIAGNOSIS — J3081 Allergic rhinitis due to animal (cat) (dog) hair and dander: Secondary | ICD-10-CM | POA: Diagnosis not present

## 2016-02-05 DIAGNOSIS — J4520 Mild intermittent asthma, uncomplicated: Secondary | ICD-10-CM | POA: Diagnosis not present

## 2016-02-05 DIAGNOSIS — J302 Other seasonal allergic rhinitis: Secondary | ICD-10-CM | POA: Diagnosis not present

## 2016-02-06 DIAGNOSIS — M67911 Unspecified disorder of synovium and tendon, right shoulder: Secondary | ICD-10-CM | POA: Diagnosis not present

## 2016-02-12 DIAGNOSIS — J301 Allergic rhinitis due to pollen: Secondary | ICD-10-CM | POA: Diagnosis not present

## 2016-02-12 DIAGNOSIS — J3081 Allergic rhinitis due to animal (cat) (dog) hair and dander: Secondary | ICD-10-CM | POA: Diagnosis not present

## 2016-02-12 DIAGNOSIS — J302 Other seasonal allergic rhinitis: Secondary | ICD-10-CM | POA: Diagnosis not present

## 2016-02-12 DIAGNOSIS — Z23 Encounter for immunization: Secondary | ICD-10-CM | POA: Diagnosis not present

## 2016-02-12 DIAGNOSIS — J4520 Mild intermittent asthma, uncomplicated: Secondary | ICD-10-CM | POA: Diagnosis not present

## 2016-02-15 DIAGNOSIS — F410 Panic disorder [episodic paroxysmal anxiety] without agoraphobia: Secondary | ICD-10-CM | POA: Diagnosis not present

## 2016-02-15 DIAGNOSIS — M6281 Muscle weakness (generalized): Secondary | ICD-10-CM | POA: Diagnosis not present

## 2016-02-15 DIAGNOSIS — M25511 Pain in right shoulder: Secondary | ICD-10-CM | POA: Diagnosis not present

## 2016-02-18 DIAGNOSIS — J301 Allergic rhinitis due to pollen: Secondary | ICD-10-CM | POA: Diagnosis not present

## 2016-02-18 DIAGNOSIS — J3081 Allergic rhinitis due to animal (cat) (dog) hair and dander: Secondary | ICD-10-CM | POA: Diagnosis not present

## 2016-02-18 DIAGNOSIS — J302 Other seasonal allergic rhinitis: Secondary | ICD-10-CM | POA: Diagnosis not present

## 2016-02-18 DIAGNOSIS — J4520 Mild intermittent asthma, uncomplicated: Secondary | ICD-10-CM | POA: Diagnosis not present

## 2016-02-19 DIAGNOSIS — F410 Panic disorder [episodic paroxysmal anxiety] without agoraphobia: Secondary | ICD-10-CM | POA: Diagnosis not present

## 2016-02-20 DIAGNOSIS — M545 Low back pain: Secondary | ICD-10-CM | POA: Diagnosis not present

## 2016-02-20 DIAGNOSIS — M5136 Other intervertebral disc degeneration, lumbar region: Secondary | ICD-10-CM | POA: Diagnosis not present

## 2016-02-20 DIAGNOSIS — M47816 Spondylosis without myelopathy or radiculopathy, lumbar region: Secondary | ICD-10-CM | POA: Diagnosis not present

## 2016-02-21 DIAGNOSIS — F4310 Post-traumatic stress disorder, unspecified: Secondary | ICD-10-CM | POA: Diagnosis not present

## 2016-02-22 DIAGNOSIS — F410 Panic disorder [episodic paroxysmal anxiety] without agoraphobia: Secondary | ICD-10-CM | POA: Diagnosis not present

## 2016-02-25 DIAGNOSIS — Z9882 Breast implant status: Secondary | ICD-10-CM | POA: Diagnosis not present

## 2016-02-25 DIAGNOSIS — F410 Panic disorder [episodic paroxysmal anxiety] without agoraphobia: Secondary | ICD-10-CM | POA: Diagnosis not present

## 2016-02-25 DIAGNOSIS — D709 Neutropenia, unspecified: Secondary | ICD-10-CM | POA: Diagnosis not present

## 2016-02-25 DIAGNOSIS — G894 Chronic pain syndrome: Secondary | ICD-10-CM | POA: Diagnosis not present

## 2016-02-25 DIAGNOSIS — J3081 Allergic rhinitis due to animal (cat) (dog) hair and dander: Secondary | ICD-10-CM | POA: Diagnosis not present

## 2016-02-25 DIAGNOSIS — J4520 Mild intermittent asthma, uncomplicated: Secondary | ICD-10-CM | POA: Diagnosis not present

## 2016-02-25 DIAGNOSIS — J302 Other seasonal allergic rhinitis: Secondary | ICD-10-CM | POA: Diagnosis not present

## 2016-02-25 DIAGNOSIS — F4310 Post-traumatic stress disorder, unspecified: Secondary | ICD-10-CM | POA: Diagnosis not present

## 2016-02-25 DIAGNOSIS — J301 Allergic rhinitis due to pollen: Secondary | ICD-10-CM | POA: Diagnosis not present

## 2016-02-27 DIAGNOSIS — M25511 Pain in right shoulder: Secondary | ICD-10-CM | POA: Diagnosis not present

## 2016-02-27 DIAGNOSIS — M6281 Muscle weakness (generalized): Secondary | ICD-10-CM | POA: Diagnosis not present

## 2016-02-29 DIAGNOSIS — F4310 Post-traumatic stress disorder, unspecified: Secondary | ICD-10-CM | POA: Diagnosis not present

## 2016-03-03 DIAGNOSIS — J3081 Allergic rhinitis due to animal (cat) (dog) hair and dander: Secondary | ICD-10-CM | POA: Diagnosis not present

## 2016-03-03 DIAGNOSIS — F410 Panic disorder [episodic paroxysmal anxiety] without agoraphobia: Secondary | ICD-10-CM | POA: Diagnosis not present

## 2016-03-03 DIAGNOSIS — J302 Other seasonal allergic rhinitis: Secondary | ICD-10-CM | POA: Diagnosis not present

## 2016-03-03 DIAGNOSIS — J4520 Mild intermittent asthma, uncomplicated: Secondary | ICD-10-CM | POA: Diagnosis not present

## 2016-03-03 DIAGNOSIS — J301 Allergic rhinitis due to pollen: Secondary | ICD-10-CM | POA: Diagnosis not present

## 2016-03-10 DIAGNOSIS — F410 Panic disorder [episodic paroxysmal anxiety] without agoraphobia: Secondary | ICD-10-CM | POA: Diagnosis not present

## 2016-03-11 DIAGNOSIS — M2031 Hallux varus (acquired), right foot: Secondary | ICD-10-CM | POA: Diagnosis not present

## 2016-03-17 DIAGNOSIS — F410 Panic disorder [episodic paroxysmal anxiety] without agoraphobia: Secondary | ICD-10-CM | POA: Diagnosis not present

## 2016-03-24 DIAGNOSIS — F4310 Post-traumatic stress disorder, unspecified: Secondary | ICD-10-CM | POA: Diagnosis not present

## 2016-03-25 DIAGNOSIS — M6281 Muscle weakness (generalized): Secondary | ICD-10-CM | POA: Diagnosis not present

## 2016-03-25 DIAGNOSIS — M25511 Pain in right shoulder: Secondary | ICD-10-CM | POA: Diagnosis not present

## 2016-03-28 DIAGNOSIS — J4530 Mild persistent asthma, uncomplicated: Secondary | ICD-10-CM | POA: Diagnosis not present

## 2016-03-28 DIAGNOSIS — J309 Allergic rhinitis, unspecified: Secondary | ICD-10-CM | POA: Diagnosis not present

## 2016-03-28 DIAGNOSIS — J3081 Allergic rhinitis due to animal (cat) (dog) hair and dander: Secondary | ICD-10-CM | POA: Diagnosis not present

## 2016-03-28 DIAGNOSIS — J3089 Other allergic rhinitis: Secondary | ICD-10-CM | POA: Diagnosis not present

## 2016-03-28 DIAGNOSIS — J301 Allergic rhinitis due to pollen: Secondary | ICD-10-CM | POA: Diagnosis not present

## 2016-03-31 DIAGNOSIS — F4310 Post-traumatic stress disorder, unspecified: Secondary | ICD-10-CM | POA: Diagnosis not present

## 2016-03-31 DIAGNOSIS — M25372 Other instability, left ankle: Secondary | ICD-10-CM | POA: Diagnosis not present

## 2016-03-31 DIAGNOSIS — Q663 Other congenital varus deformities of feet: Secondary | ICD-10-CM | POA: Diagnosis not present

## 2016-04-02 DIAGNOSIS — F410 Panic disorder [episodic paroxysmal anxiety] without agoraphobia: Secondary | ICD-10-CM | POA: Diagnosis not present

## 2016-04-04 DIAGNOSIS — F410 Panic disorder [episodic paroxysmal anxiety] without agoraphobia: Secondary | ICD-10-CM | POA: Diagnosis not present

## 2016-04-07 DIAGNOSIS — F410 Panic disorder [episodic paroxysmal anxiety] without agoraphobia: Secondary | ICD-10-CM | POA: Diagnosis not present

## 2016-04-08 DIAGNOSIS — J301 Allergic rhinitis due to pollen: Secondary | ICD-10-CM | POA: Diagnosis not present

## 2016-04-09 DIAGNOSIS — J3081 Allergic rhinitis due to animal (cat) (dog) hair and dander: Secondary | ICD-10-CM | POA: Diagnosis not present

## 2016-04-09 DIAGNOSIS — F410 Panic disorder [episodic paroxysmal anxiety] without agoraphobia: Secondary | ICD-10-CM | POA: Diagnosis not present

## 2016-04-09 DIAGNOSIS — J3089 Other allergic rhinitis: Secondary | ICD-10-CM | POA: Diagnosis not present

## 2016-04-13 DIAGNOSIS — R220 Localized swelling, mass and lump, head: Secondary | ICD-10-CM | POA: Diagnosis not present

## 2016-04-13 DIAGNOSIS — L03011 Cellulitis of right finger: Secondary | ICD-10-CM | POA: Diagnosis not present

## 2016-04-16 DIAGNOSIS — F410 Panic disorder [episodic paroxysmal anxiety] without agoraphobia: Secondary | ICD-10-CM | POA: Diagnosis not present

## 2016-04-23 ENCOUNTER — Other Ambulatory Visit: Payer: Self-pay | Admitting: Allergy

## 2016-04-23 ENCOUNTER — Ambulatory Visit
Admission: RE | Admit: 2016-04-23 | Discharge: 2016-04-23 | Disposition: A | Discharge status: Home / Self Care | Payer: BLUE CROSS/BLUE SHIELD | Source: Ambulatory Visit | Attending: Allergy | Admitting: Allergy

## 2016-04-23 DIAGNOSIS — J45909 Unspecified asthma, uncomplicated: Secondary | ICD-10-CM | POA: Diagnosis not present

## 2016-04-23 DIAGNOSIS — J3081 Allergic rhinitis due to animal (cat) (dog) hair and dander: Secondary | ICD-10-CM | POA: Diagnosis not present

## 2016-04-23 DIAGNOSIS — J301 Allergic rhinitis due to pollen: Secondary | ICD-10-CM | POA: Diagnosis not present

## 2016-04-23 DIAGNOSIS — J4530 Mild persistent asthma, uncomplicated: Secondary | ICD-10-CM

## 2016-04-23 DIAGNOSIS — J3089 Other allergic rhinitis: Secondary | ICD-10-CM | POA: Diagnosis not present

## 2016-04-24 DIAGNOSIS — F4310 Post-traumatic stress disorder, unspecified: Secondary | ICD-10-CM | POA: Diagnosis not present

## 2016-04-29 DIAGNOSIS — D709 Neutropenia, unspecified: Secondary | ICD-10-CM | POA: Diagnosis not present

## 2016-04-29 DIAGNOSIS — F4310 Post-traumatic stress disorder, unspecified: Secondary | ICD-10-CM | POA: Diagnosis not present

## 2016-04-29 DIAGNOSIS — K589 Irritable bowel syndrome without diarrhea: Secondary | ICD-10-CM | POA: Diagnosis not present

## 2016-04-29 DIAGNOSIS — G894 Chronic pain syndrome: Secondary | ICD-10-CM | POA: Diagnosis not present

## 2016-04-30 DIAGNOSIS — F410 Panic disorder [episodic paroxysmal anxiety] without agoraphobia: Secondary | ICD-10-CM | POA: Diagnosis not present

## 2016-05-01 DIAGNOSIS — M25572 Pain in left ankle and joints of left foot: Secondary | ICD-10-CM | POA: Diagnosis not present

## 2016-05-01 DIAGNOSIS — M6281 Muscle weakness (generalized): Secondary | ICD-10-CM | POA: Diagnosis not present

## 2016-05-06 DIAGNOSIS — F410 Panic disorder [episodic paroxysmal anxiety] without agoraphobia: Secondary | ICD-10-CM | POA: Diagnosis not present

## 2016-05-16 DIAGNOSIS — F4310 Post-traumatic stress disorder, unspecified: Secondary | ICD-10-CM | POA: Diagnosis not present

## 2016-05-19 DIAGNOSIS — M25519 Pain in unspecified shoulder: Secondary | ICD-10-CM | POA: Diagnosis not present

## 2016-05-19 DIAGNOSIS — M545 Low back pain: Secondary | ICD-10-CM | POA: Diagnosis not present

## 2016-05-19 DIAGNOSIS — M25579 Pain in unspecified ankle and joints of unspecified foot: Secondary | ICD-10-CM | POA: Diagnosis not present

## 2016-05-19 DIAGNOSIS — Z79899 Other long term (current) drug therapy: Secondary | ICD-10-CM | POA: Diagnosis not present

## 2016-05-19 DIAGNOSIS — Z79891 Long term (current) use of opiate analgesic: Secondary | ICD-10-CM | POA: Diagnosis not present

## 2016-05-19 DIAGNOSIS — G894 Chronic pain syndrome: Secondary | ICD-10-CM | POA: Diagnosis not present

## 2016-05-20 DIAGNOSIS — F410 Panic disorder [episodic paroxysmal anxiety] without agoraphobia: Secondary | ICD-10-CM | POA: Diagnosis not present

## 2016-05-22 DIAGNOSIS — M47816 Spondylosis without myelopathy or radiculopathy, lumbar region: Secondary | ICD-10-CM | POA: Diagnosis not present

## 2016-05-22 DIAGNOSIS — M5136 Other intervertebral disc degeneration, lumbar region: Secondary | ICD-10-CM | POA: Diagnosis not present

## 2016-05-22 DIAGNOSIS — M545 Low back pain: Secondary | ICD-10-CM | POA: Diagnosis not present

## 2016-05-26 DIAGNOSIS — F410 Panic disorder [episodic paroxysmal anxiety] without agoraphobia: Secondary | ICD-10-CM | POA: Diagnosis not present

## 2016-06-05 DIAGNOSIS — F410 Panic disorder [episodic paroxysmal anxiety] without agoraphobia: Secondary | ICD-10-CM | POA: Diagnosis not present

## 2016-06-09 DIAGNOSIS — F410 Panic disorder [episodic paroxysmal anxiety] without agoraphobia: Secondary | ICD-10-CM | POA: Diagnosis not present

## 2016-06-10 DIAGNOSIS — J3081 Allergic rhinitis due to animal (cat) (dog) hair and dander: Secondary | ICD-10-CM | POA: Diagnosis not present

## 2016-06-10 DIAGNOSIS — J301 Allergic rhinitis due to pollen: Secondary | ICD-10-CM | POA: Diagnosis not present

## 2016-06-10 DIAGNOSIS — J3089 Other allergic rhinitis: Secondary | ICD-10-CM | POA: Diagnosis not present

## 2016-06-11 DIAGNOSIS — M6281 Muscle weakness (generalized): Secondary | ICD-10-CM | POA: Diagnosis not present

## 2016-06-11 DIAGNOSIS — M25572 Pain in left ankle and joints of left foot: Secondary | ICD-10-CM | POA: Diagnosis not present

## 2016-06-16 DIAGNOSIS — F410 Panic disorder [episodic paroxysmal anxiety] without agoraphobia: Secondary | ICD-10-CM | POA: Diagnosis not present

## 2016-06-17 DIAGNOSIS — M25572 Pain in left ankle and joints of left foot: Secondary | ICD-10-CM | POA: Diagnosis not present

## 2016-06-17 DIAGNOSIS — M6281 Muscle weakness (generalized): Secondary | ICD-10-CM | POA: Diagnosis not present

## 2016-06-23 DIAGNOSIS — F410 Panic disorder [episodic paroxysmal anxiety] without agoraphobia: Secondary | ICD-10-CM | POA: Diagnosis not present

## 2016-06-25 DIAGNOSIS — M6281 Muscle weakness (generalized): Secondary | ICD-10-CM | POA: Diagnosis not present

## 2016-06-25 DIAGNOSIS — M5136 Other intervertebral disc degeneration, lumbar region: Secondary | ICD-10-CM | POA: Diagnosis not present

## 2016-06-25 DIAGNOSIS — M545 Low back pain: Secondary | ICD-10-CM | POA: Diagnosis not present

## 2016-06-25 DIAGNOSIS — M25572 Pain in left ankle and joints of left foot: Secondary | ICD-10-CM | POA: Diagnosis not present

## 2016-06-25 DIAGNOSIS — M791 Myalgia: Secondary | ICD-10-CM | POA: Diagnosis not present

## 2016-06-25 DIAGNOSIS — M47816 Spondylosis without myelopathy or radiculopathy, lumbar region: Secondary | ICD-10-CM | POA: Diagnosis not present

## 2016-06-27 DIAGNOSIS — K580 Irritable bowel syndrome with diarrhea: Secondary | ICD-10-CM | POA: Diagnosis not present

## 2016-06-27 DIAGNOSIS — D709 Neutropenia, unspecified: Secondary | ICD-10-CM | POA: Diagnosis not present

## 2016-06-27 DIAGNOSIS — J040 Acute laryngitis: Secondary | ICD-10-CM | POA: Diagnosis not present

## 2016-06-27 DIAGNOSIS — G894 Chronic pain syndrome: Secondary | ICD-10-CM | POA: Diagnosis not present

## 2016-06-30 DIAGNOSIS — J3081 Allergic rhinitis due to animal (cat) (dog) hair and dander: Secondary | ICD-10-CM | POA: Diagnosis not present

## 2016-06-30 DIAGNOSIS — J3089 Other allergic rhinitis: Secondary | ICD-10-CM | POA: Diagnosis not present

## 2016-06-30 DIAGNOSIS — J301 Allergic rhinitis due to pollen: Secondary | ICD-10-CM | POA: Diagnosis not present

## 2016-07-01 DIAGNOSIS — F410 Panic disorder [episodic paroxysmal anxiety] without agoraphobia: Secondary | ICD-10-CM | POA: Diagnosis not present

## 2016-07-02 DIAGNOSIS — F4310 Post-traumatic stress disorder, unspecified: Secondary | ICD-10-CM | POA: Diagnosis not present

## 2016-07-08 DIAGNOSIS — J3081 Allergic rhinitis due to animal (cat) (dog) hair and dander: Secondary | ICD-10-CM | POA: Diagnosis not present

## 2016-07-08 DIAGNOSIS — J301 Allergic rhinitis due to pollen: Secondary | ICD-10-CM | POA: Diagnosis not present

## 2016-07-08 DIAGNOSIS — J3089 Other allergic rhinitis: Secondary | ICD-10-CM | POA: Diagnosis not present

## 2016-07-11 DIAGNOSIS — F4310 Post-traumatic stress disorder, unspecified: Secondary | ICD-10-CM | POA: Diagnosis not present

## 2016-07-14 DIAGNOSIS — J301 Allergic rhinitis due to pollen: Secondary | ICD-10-CM | POA: Diagnosis not present

## 2016-07-14 DIAGNOSIS — J3089 Other allergic rhinitis: Secondary | ICD-10-CM | POA: Diagnosis not present

## 2016-07-14 DIAGNOSIS — J3081 Allergic rhinitis due to animal (cat) (dog) hair and dander: Secondary | ICD-10-CM | POA: Diagnosis not present

## 2016-07-15 DIAGNOSIS — F410 Panic disorder [episodic paroxysmal anxiety] without agoraphobia: Secondary | ICD-10-CM | POA: Diagnosis not present

## 2016-07-16 ENCOUNTER — Other Ambulatory Visit: Payer: Self-pay | Admitting: Physician Assistant

## 2016-07-16 DIAGNOSIS — Q663 Other congenital varus deformities of feet, unspecified foot: Secondary | ICD-10-CM

## 2016-07-17 DIAGNOSIS — Z3041 Encounter for surveillance of contraceptive pills: Secondary | ICD-10-CM | POA: Diagnosis not present

## 2016-07-17 DIAGNOSIS — R61 Generalized hyperhidrosis: Secondary | ICD-10-CM | POA: Diagnosis not present

## 2016-07-17 DIAGNOSIS — Z3009 Encounter for other general counseling and advice on contraception: Secondary | ICD-10-CM | POA: Diagnosis not present

## 2016-07-17 DIAGNOSIS — Z1329 Encounter for screening for other suspected endocrine disorder: Secondary | ICD-10-CM | POA: Diagnosis not present

## 2016-07-21 DIAGNOSIS — J3081 Allergic rhinitis due to animal (cat) (dog) hair and dander: Secondary | ICD-10-CM | POA: Diagnosis not present

## 2016-07-21 DIAGNOSIS — J301 Allergic rhinitis due to pollen: Secondary | ICD-10-CM | POA: Diagnosis not present

## 2016-07-21 DIAGNOSIS — J3089 Other allergic rhinitis: Secondary | ICD-10-CM | POA: Diagnosis not present

## 2016-07-21 DIAGNOSIS — F410 Panic disorder [episodic paroxysmal anxiety] without agoraphobia: Secondary | ICD-10-CM | POA: Diagnosis not present

## 2016-07-28 ENCOUNTER — Ambulatory Visit
Admission: RE | Admit: 2016-07-28 | Discharge: 2016-07-28 | Disposition: A | Discharge status: Home / Self Care | Payer: BLUE CROSS/BLUE SHIELD | Source: Ambulatory Visit | Attending: Physician Assistant | Admitting: Physician Assistant

## 2016-07-28 DIAGNOSIS — Q663 Other congenital varus deformities of feet, unspecified foot: Secondary | ICD-10-CM

## 2016-07-28 DIAGNOSIS — M25572 Pain in left ankle and joints of left foot: Secondary | ICD-10-CM | POA: Diagnosis not present

## 2016-07-29 DIAGNOSIS — M79645 Pain in left finger(s): Secondary | ICD-10-CM | POA: Diagnosis not present

## 2016-07-29 DIAGNOSIS — L03019 Cellulitis of unspecified finger: Secondary | ICD-10-CM | POA: Diagnosis not present

## 2016-07-30 DIAGNOSIS — J301 Allergic rhinitis due to pollen: Secondary | ICD-10-CM | POA: Diagnosis not present

## 2016-07-30 DIAGNOSIS — J3081 Allergic rhinitis due to animal (cat) (dog) hair and dander: Secondary | ICD-10-CM | POA: Diagnosis not present

## 2016-07-30 DIAGNOSIS — J3089 Other allergic rhinitis: Secondary | ICD-10-CM | POA: Diagnosis not present

## 2016-07-31 DIAGNOSIS — Q663 Other congenital varus deformities of feet: Secondary | ICD-10-CM | POA: Diagnosis not present

## 2016-07-31 DIAGNOSIS — F4310 Post-traumatic stress disorder, unspecified: Secondary | ICD-10-CM | POA: Diagnosis not present

## 2016-08-04 DIAGNOSIS — J3089 Other allergic rhinitis: Secondary | ICD-10-CM | POA: Diagnosis not present

## 2016-08-04 DIAGNOSIS — J3081 Allergic rhinitis due to animal (cat) (dog) hair and dander: Secondary | ICD-10-CM | POA: Diagnosis not present

## 2016-08-04 DIAGNOSIS — J301 Allergic rhinitis due to pollen: Secondary | ICD-10-CM | POA: Diagnosis not present

## 2016-08-05 DIAGNOSIS — K219 Gastro-esophageal reflux disease without esophagitis: Secondary | ICD-10-CM | POA: Diagnosis not present

## 2016-08-05 DIAGNOSIS — J342 Deviated nasal septum: Secondary | ICD-10-CM | POA: Diagnosis not present

## 2016-08-05 DIAGNOSIS — J343 Hypertrophy of nasal turbinates: Secondary | ICD-10-CM | POA: Diagnosis not present

## 2016-08-05 DIAGNOSIS — J302 Other seasonal allergic rhinitis: Secondary | ICD-10-CM | POA: Diagnosis not present

## 2016-08-05 DIAGNOSIS — F410 Panic disorder [episodic paroxysmal anxiety] without agoraphobia: Secondary | ICD-10-CM | POA: Diagnosis not present

## 2016-08-08 DIAGNOSIS — J3089 Other allergic rhinitis: Secondary | ICD-10-CM | POA: Diagnosis not present

## 2016-08-08 DIAGNOSIS — J301 Allergic rhinitis due to pollen: Secondary | ICD-10-CM | POA: Diagnosis not present

## 2016-08-08 DIAGNOSIS — J3081 Allergic rhinitis due to animal (cat) (dog) hair and dander: Secondary | ICD-10-CM | POA: Diagnosis not present

## 2016-08-11 DIAGNOSIS — J3089 Other allergic rhinitis: Secondary | ICD-10-CM | POA: Diagnosis not present

## 2016-08-11 DIAGNOSIS — J301 Allergic rhinitis due to pollen: Secondary | ICD-10-CM | POA: Diagnosis not present

## 2016-08-11 DIAGNOSIS — J3081 Allergic rhinitis due to animal (cat) (dog) hair and dander: Secondary | ICD-10-CM | POA: Diagnosis not present

## 2016-08-11 DIAGNOSIS — F410 Panic disorder [episodic paroxysmal anxiety] without agoraphobia: Secondary | ICD-10-CM | POA: Diagnosis not present

## 2016-08-13 DIAGNOSIS — J3081 Allergic rhinitis due to animal (cat) (dog) hair and dander: Secondary | ICD-10-CM | POA: Diagnosis not present

## 2016-08-13 DIAGNOSIS — J301 Allergic rhinitis due to pollen: Secondary | ICD-10-CM | POA: Diagnosis not present

## 2016-08-13 DIAGNOSIS — J3089 Other allergic rhinitis: Secondary | ICD-10-CM | POA: Diagnosis not present

## 2016-08-14 DIAGNOSIS — F410 Panic disorder [episodic paroxysmal anxiety] without agoraphobia: Secondary | ICD-10-CM | POA: Diagnosis not present

## 2016-08-15 DIAGNOSIS — M25572 Pain in left ankle and joints of left foot: Secondary | ICD-10-CM | POA: Diagnosis not present

## 2016-08-15 DIAGNOSIS — M25561 Pain in right knee: Secondary | ICD-10-CM | POA: Diagnosis not present

## 2016-08-15 DIAGNOSIS — M25571 Pain in right ankle and joints of right foot: Secondary | ICD-10-CM | POA: Diagnosis not present

## 2016-08-15 DIAGNOSIS — M25562 Pain in left knee: Secondary | ICD-10-CM | POA: Diagnosis not present

## 2016-08-18 DIAGNOSIS — F410 Panic disorder [episodic paroxysmal anxiety] without agoraphobia: Secondary | ICD-10-CM | POA: Diagnosis not present

## 2016-08-18 DIAGNOSIS — J3081 Allergic rhinitis due to animal (cat) (dog) hair and dander: Secondary | ICD-10-CM | POA: Diagnosis not present

## 2016-08-18 DIAGNOSIS — J3089 Other allergic rhinitis: Secondary | ICD-10-CM | POA: Diagnosis not present

## 2016-08-18 DIAGNOSIS — J301 Allergic rhinitis due to pollen: Secondary | ICD-10-CM | POA: Diagnosis not present

## 2016-08-22 DIAGNOSIS — F410 Panic disorder [episodic paroxysmal anxiety] without agoraphobia: Secondary | ICD-10-CM | POA: Diagnosis not present

## 2016-08-22 DIAGNOSIS — J301 Allergic rhinitis due to pollen: Secondary | ICD-10-CM | POA: Diagnosis not present

## 2016-08-22 DIAGNOSIS — J3089 Other allergic rhinitis: Secondary | ICD-10-CM | POA: Diagnosis not present

## 2016-08-22 DIAGNOSIS — J3081 Allergic rhinitis due to animal (cat) (dog) hair and dander: Secondary | ICD-10-CM | POA: Diagnosis not present

## 2016-08-25 DIAGNOSIS — F410 Panic disorder [episodic paroxysmal anxiety] without agoraphobia: Secondary | ICD-10-CM | POA: Diagnosis not present

## 2016-08-25 DIAGNOSIS — J3089 Other allergic rhinitis: Secondary | ICD-10-CM | POA: Diagnosis not present

## 2016-08-25 DIAGNOSIS — J3081 Allergic rhinitis due to animal (cat) (dog) hair and dander: Secondary | ICD-10-CM | POA: Diagnosis not present

## 2016-08-25 DIAGNOSIS — J301 Allergic rhinitis due to pollen: Secondary | ICD-10-CM | POA: Diagnosis not present

## 2016-08-26 DIAGNOSIS — M25571 Pain in right ankle and joints of right foot: Secondary | ICD-10-CM | POA: Diagnosis not present

## 2016-08-26 DIAGNOSIS — M25562 Pain in left knee: Secondary | ICD-10-CM | POA: Diagnosis not present

## 2016-08-26 DIAGNOSIS — M25572 Pain in left ankle and joints of left foot: Secondary | ICD-10-CM | POA: Diagnosis not present

## 2016-08-26 DIAGNOSIS — M25561 Pain in right knee: Secondary | ICD-10-CM | POA: Diagnosis not present

## 2016-09-01 DIAGNOSIS — F4310 Post-traumatic stress disorder, unspecified: Secondary | ICD-10-CM | POA: Diagnosis not present

## 2016-09-03 DIAGNOSIS — J3081 Allergic rhinitis due to animal (cat) (dog) hair and dander: Secondary | ICD-10-CM | POA: Diagnosis not present

## 2016-09-03 DIAGNOSIS — F410 Panic disorder [episodic paroxysmal anxiety] without agoraphobia: Secondary | ICD-10-CM | POA: Diagnosis not present

## 2016-09-03 DIAGNOSIS — J3089 Other allergic rhinitis: Secondary | ICD-10-CM | POA: Diagnosis not present

## 2016-09-03 DIAGNOSIS — J301 Allergic rhinitis due to pollen: Secondary | ICD-10-CM | POA: Diagnosis not present

## 2016-09-05 DIAGNOSIS — D709 Neutropenia, unspecified: Secondary | ICD-10-CM | POA: Diagnosis not present

## 2016-09-05 DIAGNOSIS — K580 Irritable bowel syndrome with diarrhea: Secondary | ICD-10-CM | POA: Diagnosis not present

## 2016-09-05 DIAGNOSIS — F4310 Post-traumatic stress disorder, unspecified: Secondary | ICD-10-CM | POA: Diagnosis not present

## 2016-09-05 DIAGNOSIS — F410 Panic disorder [episodic paroxysmal anxiety] without agoraphobia: Secondary | ICD-10-CM | POA: Diagnosis not present

## 2016-09-05 DIAGNOSIS — G894 Chronic pain syndrome: Secondary | ICD-10-CM | POA: Diagnosis not present

## 2016-09-05 DIAGNOSIS — G4762 Sleep related leg cramps: Secondary | ICD-10-CM | POA: Diagnosis not present

## 2016-09-08 DIAGNOSIS — J301 Allergic rhinitis due to pollen: Secondary | ICD-10-CM | POA: Diagnosis not present

## 2016-09-08 DIAGNOSIS — F410 Panic disorder [episodic paroxysmal anxiety] without agoraphobia: Secondary | ICD-10-CM | POA: Diagnosis not present

## 2016-09-08 DIAGNOSIS — J3081 Allergic rhinitis due to animal (cat) (dog) hair and dander: Secondary | ICD-10-CM | POA: Diagnosis not present

## 2016-09-08 DIAGNOSIS — J3089 Other allergic rhinitis: Secondary | ICD-10-CM | POA: Diagnosis not present

## 2016-09-11 DIAGNOSIS — M25572 Pain in left ankle and joints of left foot: Secondary | ICD-10-CM | POA: Diagnosis not present

## 2016-09-11 DIAGNOSIS — M25571 Pain in right ankle and joints of right foot: Secondary | ICD-10-CM | POA: Diagnosis not present

## 2016-09-11 DIAGNOSIS — M25562 Pain in left knee: Secondary | ICD-10-CM | POA: Diagnosis not present

## 2016-09-11 DIAGNOSIS — M25561 Pain in right knee: Secondary | ICD-10-CM | POA: Diagnosis not present

## 2016-09-12 DIAGNOSIS — J3081 Allergic rhinitis due to animal (cat) (dog) hair and dander: Secondary | ICD-10-CM | POA: Diagnosis not present

## 2016-09-12 DIAGNOSIS — J301 Allergic rhinitis due to pollen: Secondary | ICD-10-CM | POA: Diagnosis not present

## 2016-09-12 DIAGNOSIS — J3089 Other allergic rhinitis: Secondary | ICD-10-CM | POA: Diagnosis not present

## 2016-09-12 DIAGNOSIS — F410 Panic disorder [episodic paroxysmal anxiety] without agoraphobia: Secondary | ICD-10-CM | POA: Diagnosis not present

## 2016-09-15 DIAGNOSIS — F410 Panic disorder [episodic paroxysmal anxiety] without agoraphobia: Secondary | ICD-10-CM | POA: Diagnosis not present

## 2016-09-23 DIAGNOSIS — Z7289 Other problems related to lifestyle: Secondary | ICD-10-CM | POA: Diagnosis not present

## 2016-09-23 DIAGNOSIS — Z87891 Personal history of nicotine dependence: Secondary | ICD-10-CM | POA: Diagnosis not present

## 2016-09-23 DIAGNOSIS — J302 Other seasonal allergic rhinitis: Secondary | ICD-10-CM | POA: Diagnosis not present

## 2016-09-23 DIAGNOSIS — K219 Gastro-esophageal reflux disease without esophagitis: Secondary | ICD-10-CM | POA: Diagnosis not present

## 2016-09-24 DIAGNOSIS — J3089 Other allergic rhinitis: Secondary | ICD-10-CM | POA: Diagnosis not present

## 2016-09-24 DIAGNOSIS — J3081 Allergic rhinitis due to animal (cat) (dog) hair and dander: Secondary | ICD-10-CM | POA: Diagnosis not present

## 2016-09-24 DIAGNOSIS — J301 Allergic rhinitis due to pollen: Secondary | ICD-10-CM | POA: Diagnosis not present

## 2016-09-24 DIAGNOSIS — F410 Panic disorder [episodic paroxysmal anxiety] without agoraphobia: Secondary | ICD-10-CM | POA: Diagnosis not present

## 2016-09-26 DIAGNOSIS — M25562 Pain in left knee: Secondary | ICD-10-CM | POA: Diagnosis not present

## 2016-09-26 DIAGNOSIS — M25561 Pain in right knee: Secondary | ICD-10-CM | POA: Diagnosis not present

## 2016-09-26 DIAGNOSIS — M25571 Pain in right ankle and joints of right foot: Secondary | ICD-10-CM | POA: Diagnosis not present

## 2016-09-26 DIAGNOSIS — M25572 Pain in left ankle and joints of left foot: Secondary | ICD-10-CM | POA: Diagnosis not present

## 2016-09-29 DIAGNOSIS — F410 Panic disorder [episodic paroxysmal anxiety] without agoraphobia: Secondary | ICD-10-CM | POA: Diagnosis not present

## 2016-09-30 DIAGNOSIS — R350 Frequency of micturition: Secondary | ICD-10-CM | POA: Diagnosis not present

## 2016-09-30 DIAGNOSIS — J301 Allergic rhinitis due to pollen: Secondary | ICD-10-CM | POA: Diagnosis not present

## 2016-09-30 DIAGNOSIS — R61 Generalized hyperhidrosis: Secondary | ICD-10-CM | POA: Diagnosis not present

## 2016-09-30 DIAGNOSIS — J3081 Allergic rhinitis due to animal (cat) (dog) hair and dander: Secondary | ICD-10-CM | POA: Diagnosis not present

## 2016-09-30 DIAGNOSIS — Z3041 Encounter for surveillance of contraceptive pills: Secondary | ICD-10-CM | POA: Diagnosis not present

## 2016-09-30 DIAGNOSIS — J3089 Other allergic rhinitis: Secondary | ICD-10-CM | POA: Diagnosis not present

## 2016-10-01 DIAGNOSIS — M25561 Pain in right knee: Secondary | ICD-10-CM | POA: Diagnosis not present

## 2016-10-01 DIAGNOSIS — M25571 Pain in right ankle and joints of right foot: Secondary | ICD-10-CM | POA: Diagnosis not present

## 2016-10-01 DIAGNOSIS — M25572 Pain in left ankle and joints of left foot: Secondary | ICD-10-CM | POA: Diagnosis not present

## 2016-10-01 DIAGNOSIS — M25562 Pain in left knee: Secondary | ICD-10-CM | POA: Diagnosis not present

## 2016-10-06 DIAGNOSIS — F410 Panic disorder [episodic paroxysmal anxiety] without agoraphobia: Secondary | ICD-10-CM | POA: Diagnosis not present

## 2016-10-13 DIAGNOSIS — F4310 Post-traumatic stress disorder, unspecified: Secondary | ICD-10-CM | POA: Diagnosis not present

## 2016-10-15 DIAGNOSIS — F410 Panic disorder [episodic paroxysmal anxiety] without agoraphobia: Secondary | ICD-10-CM | POA: Diagnosis not present

## 2016-10-16 DIAGNOSIS — M5136 Other intervertebral disc degeneration, lumbar region: Secondary | ICD-10-CM | POA: Diagnosis not present

## 2016-10-16 DIAGNOSIS — M47816 Spondylosis without myelopathy or radiculopathy, lumbar region: Secondary | ICD-10-CM | POA: Diagnosis not present

## 2016-10-16 DIAGNOSIS — Z79899 Other long term (current) drug therapy: Secondary | ICD-10-CM | POA: Diagnosis not present

## 2016-10-16 DIAGNOSIS — M791 Myalgia: Secondary | ICD-10-CM | POA: Diagnosis not present

## 2016-10-20 DIAGNOSIS — F410 Panic disorder [episodic paroxysmal anxiety] without agoraphobia: Secondary | ICD-10-CM | POA: Diagnosis not present

## 2016-10-20 DIAGNOSIS — J3081 Allergic rhinitis due to animal (cat) (dog) hair and dander: Secondary | ICD-10-CM | POA: Diagnosis not present

## 2016-10-20 DIAGNOSIS — J301 Allergic rhinitis due to pollen: Secondary | ICD-10-CM | POA: Diagnosis not present

## 2016-10-20 DIAGNOSIS — J3089 Other allergic rhinitis: Secondary | ICD-10-CM | POA: Diagnosis not present

## 2016-10-23 DIAGNOSIS — F410 Panic disorder [episodic paroxysmal anxiety] without agoraphobia: Secondary | ICD-10-CM | POA: Diagnosis not present

## 2016-10-24 DIAGNOSIS — J3081 Allergic rhinitis due to animal (cat) (dog) hair and dander: Secondary | ICD-10-CM | POA: Diagnosis not present

## 2016-10-24 DIAGNOSIS — J301 Allergic rhinitis due to pollen: Secondary | ICD-10-CM | POA: Diagnosis not present

## 2016-10-24 DIAGNOSIS — M25511 Pain in right shoulder: Secondary | ICD-10-CM | POA: Diagnosis not present

## 2016-10-24 DIAGNOSIS — M545 Low back pain: Secondary | ICD-10-CM | POA: Diagnosis not present

## 2016-10-24 DIAGNOSIS — J3089 Other allergic rhinitis: Secondary | ICD-10-CM | POA: Diagnosis not present

## 2016-10-24 DIAGNOSIS — G894 Chronic pain syndrome: Secondary | ICD-10-CM | POA: Diagnosis not present

## 2016-10-24 DIAGNOSIS — M6281 Muscle weakness (generalized): Secondary | ICD-10-CM | POA: Diagnosis not present

## 2016-10-27 DIAGNOSIS — F410 Panic disorder [episodic paroxysmal anxiety] without agoraphobia: Secondary | ICD-10-CM | POA: Diagnosis not present

## 2016-11-03 DIAGNOSIS — F4310 Post-traumatic stress disorder, unspecified: Secondary | ICD-10-CM | POA: Diagnosis not present

## 2016-11-04 DIAGNOSIS — R632 Polyphagia: Secondary | ICD-10-CM | POA: Diagnosis not present

## 2016-11-04 DIAGNOSIS — K580 Irritable bowel syndrome with diarrhea: Secondary | ICD-10-CM | POA: Diagnosis not present

## 2016-11-04 DIAGNOSIS — D709 Neutropenia, unspecified: Secondary | ICD-10-CM | POA: Diagnosis not present

## 2016-11-04 DIAGNOSIS — G894 Chronic pain syndrome: Secondary | ICD-10-CM | POA: Diagnosis not present

## 2016-11-05 DIAGNOSIS — F410 Panic disorder [episodic paroxysmal anxiety] without agoraphobia: Secondary | ICD-10-CM | POA: Diagnosis not present

## 2016-11-07 DIAGNOSIS — J301 Allergic rhinitis due to pollen: Secondary | ICD-10-CM | POA: Diagnosis not present

## 2016-11-07 DIAGNOSIS — J3081 Allergic rhinitis due to animal (cat) (dog) hair and dander: Secondary | ICD-10-CM | POA: Diagnosis not present

## 2016-11-07 DIAGNOSIS — J3089 Other allergic rhinitis: Secondary | ICD-10-CM | POA: Diagnosis not present

## 2016-11-17 DIAGNOSIS — F410 Panic disorder [episodic paroxysmal anxiety] without agoraphobia: Secondary | ICD-10-CM | POA: Diagnosis not present

## 2016-11-19 DIAGNOSIS — G894 Chronic pain syndrome: Secondary | ICD-10-CM | POA: Diagnosis not present

## 2016-11-19 DIAGNOSIS — M545 Low back pain: Secondary | ICD-10-CM | POA: Diagnosis not present

## 2016-11-19 DIAGNOSIS — M6281 Muscle weakness (generalized): Secondary | ICD-10-CM | POA: Diagnosis not present

## 2016-11-19 DIAGNOSIS — J301 Allergic rhinitis due to pollen: Secondary | ICD-10-CM | POA: Diagnosis not present

## 2016-11-19 DIAGNOSIS — M25511 Pain in right shoulder: Secondary | ICD-10-CM | POA: Diagnosis not present

## 2016-11-19 DIAGNOSIS — J3089 Other allergic rhinitis: Secondary | ICD-10-CM | POA: Diagnosis not present

## 2016-11-19 DIAGNOSIS — J3081 Allergic rhinitis due to animal (cat) (dog) hair and dander: Secondary | ICD-10-CM | POA: Diagnosis not present

## 2016-11-21 DIAGNOSIS — F410 Panic disorder [episodic paroxysmal anxiety] without agoraphobia: Secondary | ICD-10-CM | POA: Diagnosis not present

## 2016-11-24 DIAGNOSIS — F410 Panic disorder [episodic paroxysmal anxiety] without agoraphobia: Secondary | ICD-10-CM | POA: Diagnosis not present

## 2016-11-24 DIAGNOSIS — J3081 Allergic rhinitis due to animal (cat) (dog) hair and dander: Secondary | ICD-10-CM | POA: Diagnosis not present

## 2016-11-24 DIAGNOSIS — J301 Allergic rhinitis due to pollen: Secondary | ICD-10-CM | POA: Diagnosis not present

## 2016-11-24 DIAGNOSIS — J3089 Other allergic rhinitis: Secondary | ICD-10-CM | POA: Diagnosis not present

## 2016-11-26 DIAGNOSIS — Z713 Dietary counseling and surveillance: Secondary | ICD-10-CM | POA: Diagnosis not present

## 2016-11-26 DIAGNOSIS — J4530 Mild persistent asthma, uncomplicated: Secondary | ICD-10-CM | POA: Diagnosis not present

## 2016-11-26 DIAGNOSIS — J3081 Allergic rhinitis due to animal (cat) (dog) hair and dander: Secondary | ICD-10-CM | POA: Diagnosis not present

## 2016-11-26 DIAGNOSIS — J3089 Other allergic rhinitis: Secondary | ICD-10-CM | POA: Diagnosis not present

## 2016-11-26 DIAGNOSIS — J301 Allergic rhinitis due to pollen: Secondary | ICD-10-CM | POA: Diagnosis not present

## 2016-11-27 DIAGNOSIS — M6281 Muscle weakness (generalized): Secondary | ICD-10-CM | POA: Diagnosis not present

## 2016-11-27 DIAGNOSIS — G894 Chronic pain syndrome: Secondary | ICD-10-CM | POA: Diagnosis not present

## 2016-11-27 DIAGNOSIS — M25511 Pain in right shoulder: Secondary | ICD-10-CM | POA: Diagnosis not present

## 2016-11-27 DIAGNOSIS — F410 Panic disorder [episodic paroxysmal anxiety] without agoraphobia: Secondary | ICD-10-CM | POA: Diagnosis not present

## 2016-11-27 DIAGNOSIS — M545 Low back pain: Secondary | ICD-10-CM | POA: Diagnosis not present

## 2016-12-01 DIAGNOSIS — F410 Panic disorder [episodic paroxysmal anxiety] without agoraphobia: Secondary | ICD-10-CM | POA: Diagnosis not present

## 2016-12-03 DIAGNOSIS — Z713 Dietary counseling and surveillance: Secondary | ICD-10-CM | POA: Diagnosis not present

## 2016-12-05 DIAGNOSIS — J3081 Allergic rhinitis due to animal (cat) (dog) hair and dander: Secondary | ICD-10-CM | POA: Diagnosis not present

## 2016-12-05 DIAGNOSIS — J301 Allergic rhinitis due to pollen: Secondary | ICD-10-CM | POA: Diagnosis not present

## 2016-12-05 DIAGNOSIS — J3089 Other allergic rhinitis: Secondary | ICD-10-CM | POA: Diagnosis not present

## 2016-12-05 DIAGNOSIS — F410 Panic disorder [episodic paroxysmal anxiety] without agoraphobia: Secondary | ICD-10-CM | POA: Diagnosis not present

## 2016-12-08 DIAGNOSIS — F410 Panic disorder [episodic paroxysmal anxiety] without agoraphobia: Secondary | ICD-10-CM | POA: Diagnosis not present

## 2016-12-10 DIAGNOSIS — J301 Allergic rhinitis due to pollen: Secondary | ICD-10-CM | POA: Diagnosis not present

## 2016-12-10 DIAGNOSIS — J3089 Other allergic rhinitis: Secondary | ICD-10-CM | POA: Diagnosis not present

## 2016-12-10 DIAGNOSIS — J3081 Allergic rhinitis due to animal (cat) (dog) hair and dander: Secondary | ICD-10-CM | POA: Diagnosis not present

## 2016-12-12 DIAGNOSIS — F410 Panic disorder [episodic paroxysmal anxiety] without agoraphobia: Secondary | ICD-10-CM | POA: Diagnosis not present

## 2016-12-15 DIAGNOSIS — D709 Neutropenia, unspecified: Secondary | ICD-10-CM | POA: Diagnosis not present

## 2016-12-15 DIAGNOSIS — K580 Irritable bowel syndrome with diarrhea: Secondary | ICD-10-CM | POA: Diagnosis not present

## 2016-12-15 DIAGNOSIS — G894 Chronic pain syndrome: Secondary | ICD-10-CM | POA: Diagnosis not present

## 2016-12-15 DIAGNOSIS — R632 Polyphagia: Secondary | ICD-10-CM | POA: Diagnosis not present

## 2016-12-16 DIAGNOSIS — F4310 Post-traumatic stress disorder, unspecified: Secondary | ICD-10-CM | POA: Diagnosis not present

## 2016-12-17 DIAGNOSIS — F410 Panic disorder [episodic paroxysmal anxiety] without agoraphobia: Secondary | ICD-10-CM | POA: Diagnosis not present

## 2016-12-19 DIAGNOSIS — J301 Allergic rhinitis due to pollen: Secondary | ICD-10-CM | POA: Diagnosis not present

## 2016-12-19 DIAGNOSIS — J3081 Allergic rhinitis due to animal (cat) (dog) hair and dander: Secondary | ICD-10-CM | POA: Diagnosis not present

## 2016-12-19 DIAGNOSIS — J3089 Other allergic rhinitis: Secondary | ICD-10-CM | POA: Diagnosis not present

## 2016-12-19 DIAGNOSIS — F410 Panic disorder [episodic paroxysmal anxiety] without agoraphobia: Secondary | ICD-10-CM | POA: Diagnosis not present

## 2016-12-22 DIAGNOSIS — J3089 Other allergic rhinitis: Secondary | ICD-10-CM | POA: Diagnosis not present

## 2016-12-22 DIAGNOSIS — F410 Panic disorder [episodic paroxysmal anxiety] without agoraphobia: Secondary | ICD-10-CM | POA: Diagnosis not present

## 2016-12-22 DIAGNOSIS — J301 Allergic rhinitis due to pollen: Secondary | ICD-10-CM | POA: Diagnosis not present

## 2016-12-22 DIAGNOSIS — J3081 Allergic rhinitis due to animal (cat) (dog) hair and dander: Secondary | ICD-10-CM | POA: Diagnosis not present

## 2016-12-24 DIAGNOSIS — Z713 Dietary counseling and surveillance: Secondary | ICD-10-CM | POA: Diagnosis not present

## 2016-12-26 DIAGNOSIS — F410 Panic disorder [episodic paroxysmal anxiety] without agoraphobia: Secondary | ICD-10-CM | POA: Diagnosis not present

## 2016-12-30 DIAGNOSIS — F410 Panic disorder [episodic paroxysmal anxiety] without agoraphobia: Secondary | ICD-10-CM | POA: Diagnosis not present

## 2017-01-05 DIAGNOSIS — M5136 Other intervertebral disc degeneration, lumbar region: Secondary | ICD-10-CM | POA: Diagnosis not present

## 2017-01-05 DIAGNOSIS — F410 Panic disorder [episodic paroxysmal anxiety] without agoraphobia: Secondary | ICD-10-CM | POA: Diagnosis not present

## 2017-01-05 DIAGNOSIS — M5126 Other intervertebral disc displacement, lumbar region: Secondary | ICD-10-CM | POA: Diagnosis not present

## 2017-01-12 DIAGNOSIS — F410 Panic disorder [episodic paroxysmal anxiety] without agoraphobia: Secondary | ICD-10-CM | POA: Diagnosis not present

## 2017-01-13 DIAGNOSIS — J3089 Other allergic rhinitis: Secondary | ICD-10-CM | POA: Diagnosis not present

## 2017-01-13 DIAGNOSIS — J301 Allergic rhinitis due to pollen: Secondary | ICD-10-CM | POA: Diagnosis not present

## 2017-01-13 DIAGNOSIS — J3081 Allergic rhinitis due to animal (cat) (dog) hair and dander: Secondary | ICD-10-CM | POA: Diagnosis not present

## 2017-01-14 DIAGNOSIS — Z713 Dietary counseling and surveillance: Secondary | ICD-10-CM | POA: Diagnosis not present

## 2017-01-16 DIAGNOSIS — J3089 Other allergic rhinitis: Secondary | ICD-10-CM | POA: Diagnosis not present

## 2017-01-16 DIAGNOSIS — J301 Allergic rhinitis due to pollen: Secondary | ICD-10-CM | POA: Diagnosis not present

## 2017-01-16 DIAGNOSIS — J3081 Allergic rhinitis due to animal (cat) (dog) hair and dander: Secondary | ICD-10-CM | POA: Diagnosis not present

## 2017-01-19 DIAGNOSIS — F410 Panic disorder [episodic paroxysmal anxiety] without agoraphobia: Secondary | ICD-10-CM | POA: Diagnosis not present

## 2017-01-21 DIAGNOSIS — Z713 Dietary counseling and surveillance: Secondary | ICD-10-CM | POA: Diagnosis not present

## 2017-01-23 DIAGNOSIS — J3089 Other allergic rhinitis: Secondary | ICD-10-CM | POA: Diagnosis not present

## 2017-01-23 DIAGNOSIS — J301 Allergic rhinitis due to pollen: Secondary | ICD-10-CM | POA: Diagnosis not present

## 2017-01-23 DIAGNOSIS — F410 Panic disorder [episodic paroxysmal anxiety] without agoraphobia: Secondary | ICD-10-CM | POA: Diagnosis not present

## 2017-01-23 DIAGNOSIS — J3081 Allergic rhinitis due to animal (cat) (dog) hair and dander: Secondary | ICD-10-CM | POA: Diagnosis not present

## 2017-01-26 DIAGNOSIS — M25562 Pain in left knee: Secondary | ICD-10-CM | POA: Diagnosis not present

## 2017-01-26 DIAGNOSIS — M25561 Pain in right knee: Secondary | ICD-10-CM | POA: Diagnosis not present

## 2017-01-26 DIAGNOSIS — M25572 Pain in left ankle and joints of left foot: Secondary | ICD-10-CM | POA: Diagnosis not present

## 2017-01-26 DIAGNOSIS — M25571 Pain in right ankle and joints of right foot: Secondary | ICD-10-CM | POA: Diagnosis not present

## 2017-01-26 DIAGNOSIS — F410 Panic disorder [episodic paroxysmal anxiety] without agoraphobia: Secondary | ICD-10-CM | POA: Diagnosis not present

## 2017-02-02 DIAGNOSIS — F410 Panic disorder [episodic paroxysmal anxiety] without agoraphobia: Secondary | ICD-10-CM | POA: Diagnosis not present

## 2017-02-03 DIAGNOSIS — F4310 Post-traumatic stress disorder, unspecified: Secondary | ICD-10-CM | POA: Diagnosis not present

## 2017-02-06 DIAGNOSIS — J3089 Other allergic rhinitis: Secondary | ICD-10-CM | POA: Diagnosis not present

## 2017-02-06 DIAGNOSIS — J3081 Allergic rhinitis due to animal (cat) (dog) hair and dander: Secondary | ICD-10-CM | POA: Diagnosis not present

## 2017-02-06 DIAGNOSIS — J301 Allergic rhinitis due to pollen: Secondary | ICD-10-CM | POA: Diagnosis not present

## 2017-02-09 DIAGNOSIS — F410 Panic disorder [episodic paroxysmal anxiety] without agoraphobia: Secondary | ICD-10-CM | POA: Diagnosis not present

## 2017-02-10 DIAGNOSIS — M25571 Pain in right ankle and joints of right foot: Secondary | ICD-10-CM | POA: Diagnosis not present

## 2017-02-10 DIAGNOSIS — M25572 Pain in left ankle and joints of left foot: Secondary | ICD-10-CM | POA: Diagnosis not present

## 2017-02-10 DIAGNOSIS — M25562 Pain in left knee: Secondary | ICD-10-CM | POA: Diagnosis not present

## 2017-02-10 DIAGNOSIS — M25561 Pain in right knee: Secondary | ICD-10-CM | POA: Diagnosis not present

## 2017-02-11 DIAGNOSIS — J3089 Other allergic rhinitis: Secondary | ICD-10-CM | POA: Diagnosis not present

## 2017-02-11 DIAGNOSIS — Z713 Dietary counseling and surveillance: Secondary | ICD-10-CM | POA: Diagnosis not present

## 2017-02-11 DIAGNOSIS — J3081 Allergic rhinitis due to animal (cat) (dog) hair and dander: Secondary | ICD-10-CM | POA: Diagnosis not present

## 2017-02-11 DIAGNOSIS — J301 Allergic rhinitis due to pollen: Secondary | ICD-10-CM | POA: Diagnosis not present

## 2017-02-12 DIAGNOSIS — F410 Panic disorder [episodic paroxysmal anxiety] without agoraphobia: Secondary | ICD-10-CM | POA: Diagnosis not present

## 2017-02-13 DIAGNOSIS — M25561 Pain in right knee: Secondary | ICD-10-CM | POA: Diagnosis not present

## 2017-02-13 DIAGNOSIS — M25571 Pain in right ankle and joints of right foot: Secondary | ICD-10-CM | POA: Diagnosis not present

## 2017-02-13 DIAGNOSIS — M25572 Pain in left ankle and joints of left foot: Secondary | ICD-10-CM | POA: Diagnosis not present

## 2017-02-13 DIAGNOSIS — M25562 Pain in left knee: Secondary | ICD-10-CM | POA: Diagnosis not present

## 2017-02-16 DIAGNOSIS — F410 Panic disorder [episodic paroxysmal anxiety] without agoraphobia: Secondary | ICD-10-CM | POA: Diagnosis not present

## 2017-02-16 DIAGNOSIS — M7672 Peroneal tendinitis, left leg: Secondary | ICD-10-CM | POA: Diagnosis not present

## 2017-02-16 DIAGNOSIS — Q663 Other congenital varus deformities of feet: Secondary | ICD-10-CM | POA: Diagnosis not present

## 2017-02-18 DIAGNOSIS — M25561 Pain in right knee: Secondary | ICD-10-CM | POA: Diagnosis not present

## 2017-02-18 DIAGNOSIS — M25562 Pain in left knee: Secondary | ICD-10-CM | POA: Diagnosis not present

## 2017-02-18 DIAGNOSIS — M25572 Pain in left ankle and joints of left foot: Secondary | ICD-10-CM | POA: Diagnosis not present

## 2017-02-18 DIAGNOSIS — M25571 Pain in right ankle and joints of right foot: Secondary | ICD-10-CM | POA: Diagnosis not present

## 2017-02-18 DIAGNOSIS — J3089 Other allergic rhinitis: Secondary | ICD-10-CM | POA: Diagnosis not present

## 2017-02-18 DIAGNOSIS — J3081 Allergic rhinitis due to animal (cat) (dog) hair and dander: Secondary | ICD-10-CM | POA: Diagnosis not present

## 2017-02-18 DIAGNOSIS — J301 Allergic rhinitis due to pollen: Secondary | ICD-10-CM | POA: Diagnosis not present

## 2017-02-20 DIAGNOSIS — F410 Panic disorder [episodic paroxysmal anxiety] without agoraphobia: Secondary | ICD-10-CM | POA: Diagnosis not present

## 2017-02-20 DIAGNOSIS — J3081 Allergic rhinitis due to animal (cat) (dog) hair and dander: Secondary | ICD-10-CM | POA: Diagnosis not present

## 2017-02-20 DIAGNOSIS — J301 Allergic rhinitis due to pollen: Secondary | ICD-10-CM | POA: Diagnosis not present

## 2017-02-20 DIAGNOSIS — J3089 Other allergic rhinitis: Secondary | ICD-10-CM | POA: Diagnosis not present

## 2017-02-25 DIAGNOSIS — M25571 Pain in right ankle and joints of right foot: Secondary | ICD-10-CM | POA: Diagnosis not present

## 2017-02-25 DIAGNOSIS — M25572 Pain in left ankle and joints of left foot: Secondary | ICD-10-CM | POA: Diagnosis not present

## 2017-02-25 DIAGNOSIS — Z713 Dietary counseling and surveillance: Secondary | ICD-10-CM | POA: Diagnosis not present

## 2017-02-25 DIAGNOSIS — M25561 Pain in right knee: Secondary | ICD-10-CM | POA: Diagnosis not present

## 2017-02-25 DIAGNOSIS — M25562 Pain in left knee: Secondary | ICD-10-CM | POA: Diagnosis not present

## 2017-02-27 DIAGNOSIS — J301 Allergic rhinitis due to pollen: Secondary | ICD-10-CM | POA: Diagnosis not present

## 2017-02-27 DIAGNOSIS — J3081 Allergic rhinitis due to animal (cat) (dog) hair and dander: Secondary | ICD-10-CM | POA: Diagnosis not present

## 2017-02-27 DIAGNOSIS — J3089 Other allergic rhinitis: Secondary | ICD-10-CM | POA: Diagnosis not present

## 2017-03-02 DIAGNOSIS — F410 Panic disorder [episodic paroxysmal anxiety] without agoraphobia: Secondary | ICD-10-CM | POA: Diagnosis not present

## 2017-03-04 DIAGNOSIS — Z713 Dietary counseling and surveillance: Secondary | ICD-10-CM | POA: Diagnosis not present

## 2017-03-04 DIAGNOSIS — F4310 Post-traumatic stress disorder, unspecified: Secondary | ICD-10-CM | POA: Diagnosis not present

## 2017-03-06 DIAGNOSIS — M25571 Pain in right ankle and joints of right foot: Secondary | ICD-10-CM | POA: Diagnosis not present

## 2017-03-06 DIAGNOSIS — M25562 Pain in left knee: Secondary | ICD-10-CM | POA: Diagnosis not present

## 2017-03-06 DIAGNOSIS — M25572 Pain in left ankle and joints of left foot: Secondary | ICD-10-CM | POA: Diagnosis not present

## 2017-03-06 DIAGNOSIS — M25561 Pain in right knee: Secondary | ICD-10-CM | POA: Diagnosis not present

## 2017-03-09 DIAGNOSIS — J3081 Allergic rhinitis due to animal (cat) (dog) hair and dander: Secondary | ICD-10-CM | POA: Diagnosis not present

## 2017-03-09 DIAGNOSIS — M25561 Pain in right knee: Secondary | ICD-10-CM | POA: Diagnosis not present

## 2017-03-09 DIAGNOSIS — J3089 Other allergic rhinitis: Secondary | ICD-10-CM | POA: Diagnosis not present

## 2017-03-09 DIAGNOSIS — M25572 Pain in left ankle and joints of left foot: Secondary | ICD-10-CM | POA: Diagnosis not present

## 2017-03-09 DIAGNOSIS — J301 Allergic rhinitis due to pollen: Secondary | ICD-10-CM | POA: Diagnosis not present

## 2017-03-09 DIAGNOSIS — M25571 Pain in right ankle and joints of right foot: Secondary | ICD-10-CM | POA: Diagnosis not present

## 2017-03-09 DIAGNOSIS — M25562 Pain in left knee: Secondary | ICD-10-CM | POA: Diagnosis not present

## 2017-03-11 DIAGNOSIS — M25562 Pain in left knee: Secondary | ICD-10-CM | POA: Diagnosis not present

## 2017-03-11 DIAGNOSIS — Z713 Dietary counseling and surveillance: Secondary | ICD-10-CM | POA: Diagnosis not present

## 2017-03-11 DIAGNOSIS — M25572 Pain in left ankle and joints of left foot: Secondary | ICD-10-CM | POA: Diagnosis not present

## 2017-03-11 DIAGNOSIS — M25571 Pain in right ankle and joints of right foot: Secondary | ICD-10-CM | POA: Diagnosis not present

## 2017-03-11 DIAGNOSIS — M25561 Pain in right knee: Secondary | ICD-10-CM | POA: Diagnosis not present

## 2017-03-13 DIAGNOSIS — J301 Allergic rhinitis due to pollen: Secondary | ICD-10-CM | POA: Diagnosis not present

## 2017-03-13 DIAGNOSIS — F410 Panic disorder [episodic paroxysmal anxiety] without agoraphobia: Secondary | ICD-10-CM | POA: Diagnosis not present

## 2017-03-16 DIAGNOSIS — F410 Panic disorder [episodic paroxysmal anxiety] without agoraphobia: Secondary | ICD-10-CM | POA: Diagnosis not present

## 2017-03-16 DIAGNOSIS — J3089 Other allergic rhinitis: Secondary | ICD-10-CM | POA: Diagnosis not present

## 2017-03-16 DIAGNOSIS — J3081 Allergic rhinitis due to animal (cat) (dog) hair and dander: Secondary | ICD-10-CM | POA: Diagnosis not present

## 2017-03-17 DIAGNOSIS — J3089 Other allergic rhinitis: Secondary | ICD-10-CM | POA: Diagnosis not present

## 2017-03-17 DIAGNOSIS — J301 Allergic rhinitis due to pollen: Secondary | ICD-10-CM | POA: Diagnosis not present

## 2017-03-17 DIAGNOSIS — J3081 Allergic rhinitis due to animal (cat) (dog) hair and dander: Secondary | ICD-10-CM | POA: Diagnosis not present

## 2017-03-18 DIAGNOSIS — M25572 Pain in left ankle and joints of left foot: Secondary | ICD-10-CM | POA: Diagnosis not present

## 2017-03-18 DIAGNOSIS — M25571 Pain in right ankle and joints of right foot: Secondary | ICD-10-CM | POA: Diagnosis not present

## 2017-03-18 DIAGNOSIS — M25562 Pain in left knee: Secondary | ICD-10-CM | POA: Diagnosis not present

## 2017-03-18 DIAGNOSIS — M25561 Pain in right knee: Secondary | ICD-10-CM | POA: Diagnosis not present

## 2017-03-23 DIAGNOSIS — F410 Panic disorder [episodic paroxysmal anxiety] without agoraphobia: Secondary | ICD-10-CM | POA: Diagnosis not present

## 2017-03-25 DIAGNOSIS — Z713 Dietary counseling and surveillance: Secondary | ICD-10-CM | POA: Diagnosis not present

## 2017-03-26 DIAGNOSIS — F410 Panic disorder [episodic paroxysmal anxiety] without agoraphobia: Secondary | ICD-10-CM | POA: Diagnosis not present

## 2017-03-30 DIAGNOSIS — F410 Panic disorder [episodic paroxysmal anxiety] without agoraphobia: Secondary | ICD-10-CM | POA: Diagnosis not present

## 2017-04-01 DIAGNOSIS — M25572 Pain in left ankle and joints of left foot: Secondary | ICD-10-CM | POA: Diagnosis not present

## 2017-04-01 DIAGNOSIS — Z713 Dietary counseling and surveillance: Secondary | ICD-10-CM | POA: Diagnosis not present

## 2017-04-01 DIAGNOSIS — M25571 Pain in right ankle and joints of right foot: Secondary | ICD-10-CM | POA: Diagnosis not present

## 2017-04-01 DIAGNOSIS — M25562 Pain in left knee: Secondary | ICD-10-CM | POA: Diagnosis not present

## 2017-04-01 DIAGNOSIS — M25561 Pain in right knee: Secondary | ICD-10-CM | POA: Diagnosis not present

## 2017-04-03 DIAGNOSIS — F410 Panic disorder [episodic paroxysmal anxiety] without agoraphobia: Secondary | ICD-10-CM | POA: Diagnosis not present

## 2017-04-03 DIAGNOSIS — J3081 Allergic rhinitis due to animal (cat) (dog) hair and dander: Secondary | ICD-10-CM | POA: Diagnosis not present

## 2017-04-03 DIAGNOSIS — J3089 Other allergic rhinitis: Secondary | ICD-10-CM | POA: Diagnosis not present

## 2017-04-03 DIAGNOSIS — J301 Allergic rhinitis due to pollen: Secondary | ICD-10-CM | POA: Diagnosis not present

## 2017-04-10 DIAGNOSIS — M545 Low back pain: Secondary | ICD-10-CM | POA: Diagnosis not present

## 2017-04-10 DIAGNOSIS — L03011 Cellulitis of right finger: Secondary | ICD-10-CM | POA: Diagnosis not present

## 2017-04-10 DIAGNOSIS — M5136 Other intervertebral disc degeneration, lumbar region: Secondary | ICD-10-CM | POA: Diagnosis not present

## 2017-04-10 DIAGNOSIS — F410 Panic disorder [episodic paroxysmal anxiety] without agoraphobia: Secondary | ICD-10-CM | POA: Diagnosis not present

## 2017-04-13 DIAGNOSIS — M25571 Pain in right ankle and joints of right foot: Secondary | ICD-10-CM | POA: Diagnosis not present

## 2017-04-13 DIAGNOSIS — M25572 Pain in left ankle and joints of left foot: Secondary | ICD-10-CM | POA: Diagnosis not present

## 2017-04-13 DIAGNOSIS — M25561 Pain in right knee: Secondary | ICD-10-CM | POA: Diagnosis not present

## 2017-04-13 DIAGNOSIS — F410 Panic disorder [episodic paroxysmal anxiety] without agoraphobia: Secondary | ICD-10-CM | POA: Diagnosis not present

## 2017-04-13 DIAGNOSIS — M25562 Pain in left knee: Secondary | ICD-10-CM | POA: Diagnosis not present

## 2017-04-15 DIAGNOSIS — Z713 Dietary counseling and surveillance: Secondary | ICD-10-CM | POA: Diagnosis not present

## 2017-04-17 DIAGNOSIS — M25561 Pain in right knee: Secondary | ICD-10-CM | POA: Diagnosis not present

## 2017-04-17 DIAGNOSIS — M25572 Pain in left ankle and joints of left foot: Secondary | ICD-10-CM | POA: Diagnosis not present

## 2017-04-17 DIAGNOSIS — M25562 Pain in left knee: Secondary | ICD-10-CM | POA: Diagnosis not present

## 2017-04-17 DIAGNOSIS — M25571 Pain in right ankle and joints of right foot: Secondary | ICD-10-CM | POA: Diagnosis not present

## 2017-04-17 DIAGNOSIS — F410 Panic disorder [episodic paroxysmal anxiety] without agoraphobia: Secondary | ICD-10-CM | POA: Diagnosis not present

## 2017-04-22 DIAGNOSIS — M25572 Pain in left ankle and joints of left foot: Secondary | ICD-10-CM | POA: Diagnosis not present

## 2017-04-22 DIAGNOSIS — M25562 Pain in left knee: Secondary | ICD-10-CM | POA: Diagnosis not present

## 2017-04-22 DIAGNOSIS — F410 Panic disorder [episodic paroxysmal anxiety] without agoraphobia: Secondary | ICD-10-CM | POA: Diagnosis not present

## 2017-04-22 DIAGNOSIS — M25571 Pain in right ankle and joints of right foot: Secondary | ICD-10-CM | POA: Diagnosis not present

## 2017-04-22 DIAGNOSIS — M25561 Pain in right knee: Secondary | ICD-10-CM | POA: Diagnosis not present

## 2017-04-23 DIAGNOSIS — F4310 Post-traumatic stress disorder, unspecified: Secondary | ICD-10-CM | POA: Diagnosis not present

## 2017-04-27 DIAGNOSIS — M25571 Pain in right ankle and joints of right foot: Secondary | ICD-10-CM | POA: Diagnosis not present

## 2017-04-27 DIAGNOSIS — M25572 Pain in left ankle and joints of left foot: Secondary | ICD-10-CM | POA: Diagnosis not present

## 2017-04-27 DIAGNOSIS — M25561 Pain in right knee: Secondary | ICD-10-CM | POA: Diagnosis not present

## 2017-04-27 DIAGNOSIS — M25562 Pain in left knee: Secondary | ICD-10-CM | POA: Diagnosis not present

## 2017-05-01 DIAGNOSIS — F410 Panic disorder [episodic paroxysmal anxiety] without agoraphobia: Secondary | ICD-10-CM | POA: Diagnosis not present

## 2017-05-06 DIAGNOSIS — Z713 Dietary counseling and surveillance: Secondary | ICD-10-CM | POA: Diagnosis not present

## 2017-05-08 DIAGNOSIS — F410 Panic disorder [episodic paroxysmal anxiety] without agoraphobia: Secondary | ICD-10-CM | POA: Diagnosis not present

## 2017-05-11 DIAGNOSIS — F410 Panic disorder [episodic paroxysmal anxiety] without agoraphobia: Secondary | ICD-10-CM | POA: Diagnosis not present

## 2017-05-15 DIAGNOSIS — F410 Panic disorder [episodic paroxysmal anxiety] without agoraphobia: Secondary | ICD-10-CM | POA: Diagnosis not present

## 2017-05-18 DIAGNOSIS — F410 Panic disorder [episodic paroxysmal anxiety] without agoraphobia: Secondary | ICD-10-CM | POA: Diagnosis not present

## 2017-05-20 DIAGNOSIS — Z713 Dietary counseling and surveillance: Secondary | ICD-10-CM | POA: Diagnosis not present

## 2017-05-22 DIAGNOSIS — F410 Panic disorder [episodic paroxysmal anxiety] without agoraphobia: Secondary | ICD-10-CM | POA: Diagnosis not present

## 2017-05-25 DIAGNOSIS — F410 Panic disorder [episodic paroxysmal anxiety] without agoraphobia: Secondary | ICD-10-CM | POA: Diagnosis not present

## 2017-05-28 DIAGNOSIS — Z713 Dietary counseling and surveillance: Secondary | ICD-10-CM | POA: Diagnosis not present

## 2017-06-01 DIAGNOSIS — F410 Panic disorder [episodic paroxysmal anxiety] without agoraphobia: Secondary | ICD-10-CM | POA: Diagnosis not present

## 2017-06-03 DIAGNOSIS — Z713 Dietary counseling and surveillance: Secondary | ICD-10-CM | POA: Diagnosis not present

## 2017-06-05 DIAGNOSIS — J301 Allergic rhinitis due to pollen: Secondary | ICD-10-CM | POA: Diagnosis not present

## 2017-06-05 DIAGNOSIS — J3081 Allergic rhinitis due to animal (cat) (dog) hair and dander: Secondary | ICD-10-CM | POA: Diagnosis not present

## 2017-06-05 DIAGNOSIS — J4530 Mild persistent asthma, uncomplicated: Secondary | ICD-10-CM | POA: Diagnosis not present

## 2017-06-05 DIAGNOSIS — F410 Panic disorder [episodic paroxysmal anxiety] without agoraphobia: Secondary | ICD-10-CM | POA: Diagnosis not present

## 2017-06-05 DIAGNOSIS — H1045 Other chronic allergic conjunctivitis: Secondary | ICD-10-CM | POA: Diagnosis not present

## 2017-06-05 DIAGNOSIS — J3089 Other allergic rhinitis: Secondary | ICD-10-CM | POA: Diagnosis not present

## 2017-06-10 DIAGNOSIS — Z713 Dietary counseling and surveillance: Secondary | ICD-10-CM | POA: Diagnosis not present

## 2017-06-12 DIAGNOSIS — F410 Panic disorder [episodic paroxysmal anxiety] without agoraphobia: Secondary | ICD-10-CM | POA: Diagnosis not present

## 2017-06-17 DIAGNOSIS — Z713 Dietary counseling and surveillance: Secondary | ICD-10-CM | POA: Diagnosis not present

## 2017-06-22 DIAGNOSIS — F410 Panic disorder [episodic paroxysmal anxiety] without agoraphobia: Secondary | ICD-10-CM | POA: Diagnosis not present

## 2017-06-24 DIAGNOSIS — Z713 Dietary counseling and surveillance: Secondary | ICD-10-CM | POA: Diagnosis not present

## 2017-06-26 DIAGNOSIS — F410 Panic disorder [episodic paroxysmal anxiety] without agoraphobia: Secondary | ICD-10-CM | POA: Diagnosis not present

## 2017-06-29 DIAGNOSIS — F410 Panic disorder [episodic paroxysmal anxiety] without agoraphobia: Secondary | ICD-10-CM | POA: Diagnosis not present

## 2017-07-03 DIAGNOSIS — J301 Allergic rhinitis due to pollen: Secondary | ICD-10-CM | POA: Diagnosis not present

## 2017-07-03 DIAGNOSIS — J3081 Allergic rhinitis due to animal (cat) (dog) hair and dander: Secondary | ICD-10-CM | POA: Diagnosis not present

## 2017-07-03 DIAGNOSIS — J3089 Other allergic rhinitis: Secondary | ICD-10-CM | POA: Diagnosis not present

## 2017-07-03 DIAGNOSIS — F410 Panic disorder [episodic paroxysmal anxiety] without agoraphobia: Secondary | ICD-10-CM | POA: Diagnosis not present

## 2017-07-06 DIAGNOSIS — F410 Panic disorder [episodic paroxysmal anxiety] without agoraphobia: Secondary | ICD-10-CM | POA: Diagnosis not present

## 2017-07-07 DIAGNOSIS — G894 Chronic pain syndrome: Secondary | ICD-10-CM | POA: Diagnosis not present

## 2017-07-07 DIAGNOSIS — K580 Irritable bowel syndrome with diarrhea: Secondary | ICD-10-CM | POA: Diagnosis not present

## 2017-07-07 DIAGNOSIS — R632 Polyphagia: Secondary | ICD-10-CM | POA: Diagnosis not present

## 2017-07-07 DIAGNOSIS — D709 Neutropenia, unspecified: Secondary | ICD-10-CM | POA: Diagnosis not present

## 2017-07-07 DIAGNOSIS — G4762 Sleep related leg cramps: Secondary | ICD-10-CM | POA: Diagnosis not present

## 2017-07-08 DIAGNOSIS — Z713 Dietary counseling and surveillance: Secondary | ICD-10-CM | POA: Diagnosis not present

## 2017-07-10 DIAGNOSIS — F410 Panic disorder [episodic paroxysmal anxiety] without agoraphobia: Secondary | ICD-10-CM | POA: Diagnosis not present

## 2017-07-13 DIAGNOSIS — F410 Panic disorder [episodic paroxysmal anxiety] without agoraphobia: Secondary | ICD-10-CM | POA: Diagnosis not present

## 2017-07-14 DIAGNOSIS — M25572 Pain in left ankle and joints of left foot: Secondary | ICD-10-CM | POA: Diagnosis not present

## 2017-07-14 DIAGNOSIS — M25561 Pain in right knee: Secondary | ICD-10-CM | POA: Diagnosis not present

## 2017-07-14 DIAGNOSIS — M25562 Pain in left knee: Secondary | ICD-10-CM | POA: Diagnosis not present

## 2017-07-14 DIAGNOSIS — M25571 Pain in right ankle and joints of right foot: Secondary | ICD-10-CM | POA: Diagnosis not present

## 2017-07-17 DIAGNOSIS — F410 Panic disorder [episodic paroxysmal anxiety] without agoraphobia: Secondary | ICD-10-CM | POA: Diagnosis not present

## 2017-07-20 DIAGNOSIS — F410 Panic disorder [episodic paroxysmal anxiety] without agoraphobia: Secondary | ICD-10-CM | POA: Diagnosis not present

## 2017-07-22 DIAGNOSIS — Z01411 Encounter for gynecological examination (general) (routine) with abnormal findings: Secondary | ICD-10-CM | POA: Diagnosis not present

## 2017-07-22 DIAGNOSIS — R61 Generalized hyperhidrosis: Secondary | ICD-10-CM | POA: Diagnosis not present

## 2017-07-22 DIAGNOSIS — Z3009 Encounter for other general counseling and advice on contraception: Secondary | ICD-10-CM | POA: Diagnosis not present

## 2017-07-22 DIAGNOSIS — F419 Anxiety disorder, unspecified: Secondary | ICD-10-CM | POA: Diagnosis not present

## 2017-07-22 DIAGNOSIS — G894 Chronic pain syndrome: Secondary | ICD-10-CM | POA: Diagnosis not present

## 2017-07-24 DIAGNOSIS — M25562 Pain in left knee: Secondary | ICD-10-CM | POA: Diagnosis not present

## 2017-07-24 DIAGNOSIS — F410 Panic disorder [episodic paroxysmal anxiety] without agoraphobia: Secondary | ICD-10-CM | POA: Diagnosis not present

## 2017-07-24 DIAGNOSIS — M25572 Pain in left ankle and joints of left foot: Secondary | ICD-10-CM | POA: Diagnosis not present

## 2017-07-24 DIAGNOSIS — M25561 Pain in right knee: Secondary | ICD-10-CM | POA: Diagnosis not present

## 2017-07-24 DIAGNOSIS — M545 Low back pain: Secondary | ICD-10-CM | POA: Diagnosis not present

## 2017-07-24 DIAGNOSIS — L03011 Cellulitis of right finger: Secondary | ICD-10-CM | POA: Diagnosis not present

## 2017-07-24 DIAGNOSIS — L089 Local infection of the skin and subcutaneous tissue, unspecified: Secondary | ICD-10-CM | POA: Diagnosis not present

## 2017-07-24 DIAGNOSIS — M5136 Other intervertebral disc degeneration, lumbar region: Secondary | ICD-10-CM | POA: Diagnosis not present

## 2017-07-24 DIAGNOSIS — Z79891 Long term (current) use of opiate analgesic: Secondary | ICD-10-CM | POA: Diagnosis not present

## 2017-07-24 DIAGNOSIS — M25571 Pain in right ankle and joints of right foot: Secondary | ICD-10-CM | POA: Diagnosis not present

## 2017-07-24 DIAGNOSIS — Z6829 Body mass index (BMI) 29.0-29.9, adult: Secondary | ICD-10-CM | POA: Diagnosis not present

## 2017-07-27 DIAGNOSIS — Q663 Other congenital varus deformities of feet: Secondary | ICD-10-CM | POA: Diagnosis not present

## 2017-07-27 DIAGNOSIS — M7672 Peroneal tendinitis, left leg: Secondary | ICD-10-CM | POA: Diagnosis not present

## 2017-07-29 DIAGNOSIS — Z713 Dietary counseling and surveillance: Secondary | ICD-10-CM | POA: Diagnosis not present

## 2017-07-31 DIAGNOSIS — F410 Panic disorder [episodic paroxysmal anxiety] without agoraphobia: Secondary | ICD-10-CM | POA: Diagnosis not present

## 2017-08-03 DIAGNOSIS — F410 Panic disorder [episodic paroxysmal anxiety] without agoraphobia: Secondary | ICD-10-CM | POA: Diagnosis not present

## 2017-08-07 DIAGNOSIS — M25572 Pain in left ankle and joints of left foot: Secondary | ICD-10-CM | POA: Diagnosis not present

## 2017-08-07 DIAGNOSIS — M25571 Pain in right ankle and joints of right foot: Secondary | ICD-10-CM | POA: Diagnosis not present

## 2017-08-07 DIAGNOSIS — F410 Panic disorder [episodic paroxysmal anxiety] without agoraphobia: Secondary | ICD-10-CM | POA: Diagnosis not present

## 2017-08-07 DIAGNOSIS — M25562 Pain in left knee: Secondary | ICD-10-CM | POA: Diagnosis not present

## 2017-08-07 DIAGNOSIS — M25561 Pain in right knee: Secondary | ICD-10-CM | POA: Diagnosis not present

## 2017-08-12 DIAGNOSIS — Z713 Dietary counseling and surveillance: Secondary | ICD-10-CM | POA: Diagnosis not present

## 2017-08-12 DIAGNOSIS — M25571 Pain in right ankle and joints of right foot: Secondary | ICD-10-CM | POA: Diagnosis not present

## 2017-08-13 DIAGNOSIS — F410 Panic disorder [episodic paroxysmal anxiety] without agoraphobia: Secondary | ICD-10-CM | POA: Diagnosis not present

## 2017-08-14 DIAGNOSIS — M25562 Pain in left knee: Secondary | ICD-10-CM | POA: Diagnosis not present

## 2017-08-14 DIAGNOSIS — M25572 Pain in left ankle and joints of left foot: Secondary | ICD-10-CM | POA: Diagnosis not present

## 2017-08-14 DIAGNOSIS — M25561 Pain in right knee: Secondary | ICD-10-CM | POA: Diagnosis not present

## 2017-08-14 DIAGNOSIS — M25571 Pain in right ankle and joints of right foot: Secondary | ICD-10-CM | POA: Diagnosis not present

## 2017-08-17 DIAGNOSIS — F410 Panic disorder [episodic paroxysmal anxiety] without agoraphobia: Secondary | ICD-10-CM | POA: Diagnosis not present

## 2017-08-19 DIAGNOSIS — Z713 Dietary counseling and surveillance: Secondary | ICD-10-CM | POA: Diagnosis not present

## 2017-08-19 DIAGNOSIS — M25562 Pain in left knee: Secondary | ICD-10-CM | POA: Diagnosis not present

## 2017-08-19 DIAGNOSIS — M25572 Pain in left ankle and joints of left foot: Secondary | ICD-10-CM | POA: Diagnosis not present

## 2017-08-19 DIAGNOSIS — M25561 Pain in right knee: Secondary | ICD-10-CM | POA: Diagnosis not present

## 2017-08-19 DIAGNOSIS — M25571 Pain in right ankle and joints of right foot: Secondary | ICD-10-CM | POA: Diagnosis not present

## 2017-08-21 DIAGNOSIS — F410 Panic disorder [episodic paroxysmal anxiety] without agoraphobia: Secondary | ICD-10-CM | POA: Diagnosis not present

## 2017-08-21 DIAGNOSIS — M25561 Pain in right knee: Secondary | ICD-10-CM | POA: Diagnosis not present

## 2017-08-21 DIAGNOSIS — M25562 Pain in left knee: Secondary | ICD-10-CM | POA: Diagnosis not present

## 2017-08-21 DIAGNOSIS — M25571 Pain in right ankle and joints of right foot: Secondary | ICD-10-CM | POA: Diagnosis not present

## 2017-08-21 DIAGNOSIS — M25572 Pain in left ankle and joints of left foot: Secondary | ICD-10-CM | POA: Diagnosis not present

## 2017-08-24 DIAGNOSIS — F410 Panic disorder [episodic paroxysmal anxiety] without agoraphobia: Secondary | ICD-10-CM | POA: Diagnosis not present

## 2017-08-25 DIAGNOSIS — M6281 Muscle weakness (generalized): Secondary | ICD-10-CM | POA: Diagnosis not present

## 2017-08-25 DIAGNOSIS — G8921 Chronic pain due to trauma: Secondary | ICD-10-CM | POA: Diagnosis not present

## 2017-08-26 DIAGNOSIS — Z713 Dietary counseling and surveillance: Secondary | ICD-10-CM | POA: Diagnosis not present

## 2017-08-28 DIAGNOSIS — F410 Panic disorder [episodic paroxysmal anxiety] without agoraphobia: Secondary | ICD-10-CM | POA: Diagnosis not present

## 2017-08-28 DIAGNOSIS — M25561 Pain in right knee: Secondary | ICD-10-CM | POA: Diagnosis not present

## 2017-08-28 DIAGNOSIS — M25562 Pain in left knee: Secondary | ICD-10-CM | POA: Diagnosis not present

## 2017-08-28 DIAGNOSIS — M25571 Pain in right ankle and joints of right foot: Secondary | ICD-10-CM | POA: Diagnosis not present

## 2017-08-28 DIAGNOSIS — M25572 Pain in left ankle and joints of left foot: Secondary | ICD-10-CM | POA: Diagnosis not present

## 2017-08-31 DIAGNOSIS — F410 Panic disorder [episodic paroxysmal anxiety] without agoraphobia: Secondary | ICD-10-CM | POA: Diagnosis not present

## 2017-09-01 DIAGNOSIS — G8921 Chronic pain due to trauma: Secondary | ICD-10-CM | POA: Diagnosis not present

## 2017-09-01 DIAGNOSIS — M6281 Muscle weakness (generalized): Secondary | ICD-10-CM | POA: Diagnosis not present

## 2017-09-02 DIAGNOSIS — M25562 Pain in left knee: Secondary | ICD-10-CM | POA: Diagnosis not present

## 2017-09-02 DIAGNOSIS — M25561 Pain in right knee: Secondary | ICD-10-CM | POA: Diagnosis not present

## 2017-09-02 DIAGNOSIS — M25572 Pain in left ankle and joints of left foot: Secondary | ICD-10-CM | POA: Diagnosis not present

## 2017-09-02 DIAGNOSIS — M25571 Pain in right ankle and joints of right foot: Secondary | ICD-10-CM | POA: Diagnosis not present

## 2017-09-04 DIAGNOSIS — F410 Panic disorder [episodic paroxysmal anxiety] without agoraphobia: Secondary | ICD-10-CM | POA: Diagnosis not present

## 2017-09-07 DIAGNOSIS — F410 Panic disorder [episodic paroxysmal anxiety] without agoraphobia: Secondary | ICD-10-CM | POA: Diagnosis not present

## 2017-09-07 DIAGNOSIS — M25561 Pain in right knee: Secondary | ICD-10-CM | POA: Diagnosis not present

## 2017-09-07 DIAGNOSIS — M25571 Pain in right ankle and joints of right foot: Secondary | ICD-10-CM | POA: Diagnosis not present

## 2017-09-07 DIAGNOSIS — M25572 Pain in left ankle and joints of left foot: Secondary | ICD-10-CM | POA: Diagnosis not present

## 2017-09-07 DIAGNOSIS — M25562 Pain in left knee: Secondary | ICD-10-CM | POA: Diagnosis not present

## 2017-09-09 DIAGNOSIS — N83291 Other ovarian cyst, right side: Secondary | ICD-10-CM | POA: Diagnosis not present

## 2017-09-09 DIAGNOSIS — Z31430 Encounter of female for testing for genetic disease carrier status for procreative management: Secondary | ICD-10-CM | POA: Diagnosis not present

## 2017-09-09 DIAGNOSIS — Z319 Encounter for procreative management, unspecified: Secondary | ICD-10-CM | POA: Diagnosis not present

## 2017-09-09 DIAGNOSIS — Z713 Dietary counseling and surveillance: Secondary | ICD-10-CM | POA: Diagnosis not present

## 2017-09-09 DIAGNOSIS — E288 Other ovarian dysfunction: Secondary | ICD-10-CM | POA: Diagnosis not present

## 2017-09-10 DIAGNOSIS — J301 Allergic rhinitis due to pollen: Secondary | ICD-10-CM | POA: Diagnosis not present

## 2017-09-11 DIAGNOSIS — J3081 Allergic rhinitis due to animal (cat) (dog) hair and dander: Secondary | ICD-10-CM | POA: Diagnosis not present

## 2017-09-11 DIAGNOSIS — J3089 Other allergic rhinitis: Secondary | ICD-10-CM | POA: Diagnosis not present

## 2017-09-11 DIAGNOSIS — F410 Panic disorder [episodic paroxysmal anxiety] without agoraphobia: Secondary | ICD-10-CM | POA: Diagnosis not present

## 2017-09-14 DIAGNOSIS — F410 Panic disorder [episodic paroxysmal anxiety] without agoraphobia: Secondary | ICD-10-CM | POA: Diagnosis not present

## 2017-09-16 DIAGNOSIS — M25561 Pain in right knee: Secondary | ICD-10-CM | POA: Diagnosis not present

## 2017-09-16 DIAGNOSIS — M25562 Pain in left knee: Secondary | ICD-10-CM | POA: Diagnosis not present

## 2017-09-16 DIAGNOSIS — M25571 Pain in right ankle and joints of right foot: Secondary | ICD-10-CM | POA: Diagnosis not present

## 2017-09-16 DIAGNOSIS — M25572 Pain in left ankle and joints of left foot: Secondary | ICD-10-CM | POA: Diagnosis not present

## 2017-09-16 DIAGNOSIS — Z713 Dietary counseling and surveillance: Secondary | ICD-10-CM | POA: Diagnosis not present

## 2017-09-18 DIAGNOSIS — F410 Panic disorder [episodic paroxysmal anxiety] without agoraphobia: Secondary | ICD-10-CM | POA: Diagnosis not present

## 2017-09-22 DIAGNOSIS — M6281 Muscle weakness (generalized): Secondary | ICD-10-CM | POA: Diagnosis not present

## 2017-09-22 DIAGNOSIS — F410 Panic disorder [episodic paroxysmal anxiety] without agoraphobia: Secondary | ICD-10-CM | POA: Diagnosis not present

## 2017-09-22 DIAGNOSIS — G8921 Chronic pain due to trauma: Secondary | ICD-10-CM | POA: Diagnosis not present

## 2017-09-23 DIAGNOSIS — Z713 Dietary counseling and surveillance: Secondary | ICD-10-CM | POA: Diagnosis not present

## 2017-09-23 DIAGNOSIS — Z319 Encounter for procreative management, unspecified: Secondary | ICD-10-CM | POA: Diagnosis not present

## 2017-09-25 DIAGNOSIS — F410 Panic disorder [episodic paroxysmal anxiety] without agoraphobia: Secondary | ICD-10-CM | POA: Diagnosis not present

## 2017-09-28 DIAGNOSIS — M25571 Pain in right ankle and joints of right foot: Secondary | ICD-10-CM | POA: Diagnosis not present

## 2017-09-28 DIAGNOSIS — F410 Panic disorder [episodic paroxysmal anxiety] without agoraphobia: Secondary | ICD-10-CM | POA: Diagnosis not present

## 2017-09-28 DIAGNOSIS — M25572 Pain in left ankle and joints of left foot: Secondary | ICD-10-CM | POA: Diagnosis not present

## 2017-09-28 DIAGNOSIS — M25561 Pain in right knee: Secondary | ICD-10-CM | POA: Diagnosis not present

## 2017-09-28 DIAGNOSIS — M25562 Pain in left knee: Secondary | ICD-10-CM | POA: Diagnosis not present

## 2017-09-30 DIAGNOSIS — Z713 Dietary counseling and surveillance: Secondary | ICD-10-CM | POA: Diagnosis not present

## 2017-10-02 DIAGNOSIS — M791 Myalgia, unspecified site: Secondary | ICD-10-CM | POA: Diagnosis not present

## 2017-10-02 DIAGNOSIS — Z79891 Long term (current) use of opiate analgesic: Secondary | ICD-10-CM | POA: Diagnosis not present

## 2017-10-02 DIAGNOSIS — F410 Panic disorder [episodic paroxysmal anxiety] without agoraphobia: Secondary | ICD-10-CM | POA: Diagnosis not present

## 2017-10-02 DIAGNOSIS — M545 Low back pain: Secondary | ICD-10-CM | POA: Diagnosis not present

## 2017-10-05 DIAGNOSIS — F410 Panic disorder [episodic paroxysmal anxiety] without agoraphobia: Secondary | ICD-10-CM | POA: Diagnosis not present

## 2017-10-05 DIAGNOSIS — M6281 Muscle weakness (generalized): Secondary | ICD-10-CM | POA: Diagnosis not present

## 2017-10-05 DIAGNOSIS — G8921 Chronic pain due to trauma: Secondary | ICD-10-CM | POA: Diagnosis not present

## 2017-10-07 DIAGNOSIS — Z713 Dietary counseling and surveillance: Secondary | ICD-10-CM | POA: Diagnosis not present

## 2017-10-09 DIAGNOSIS — F410 Panic disorder [episodic paroxysmal anxiety] without agoraphobia: Secondary | ICD-10-CM | POA: Diagnosis not present

## 2017-10-12 DIAGNOSIS — M25562 Pain in left knee: Secondary | ICD-10-CM | POA: Diagnosis not present

## 2017-10-12 DIAGNOSIS — M25572 Pain in left ankle and joints of left foot: Secondary | ICD-10-CM | POA: Diagnosis not present

## 2017-10-12 DIAGNOSIS — M25571 Pain in right ankle and joints of right foot: Secondary | ICD-10-CM | POA: Diagnosis not present

## 2017-10-12 DIAGNOSIS — F410 Panic disorder [episodic paroxysmal anxiety] without agoraphobia: Secondary | ICD-10-CM | POA: Diagnosis not present

## 2017-10-12 DIAGNOSIS — M25561 Pain in right knee: Secondary | ICD-10-CM | POA: Diagnosis not present

## 2017-10-13 DIAGNOSIS — M6281 Muscle weakness (generalized): Secondary | ICD-10-CM | POA: Diagnosis not present

## 2017-10-13 DIAGNOSIS — G8921 Chronic pain due to trauma: Secondary | ICD-10-CM | POA: Diagnosis not present

## 2017-10-14 DIAGNOSIS — Z713 Dietary counseling and surveillance: Secondary | ICD-10-CM | POA: Diagnosis not present

## 2017-10-15 DIAGNOSIS — Z319 Encounter for procreative management, unspecified: Secondary | ICD-10-CM | POA: Diagnosis not present

## 2017-10-16 DIAGNOSIS — F410 Panic disorder [episodic paroxysmal anxiety] without agoraphobia: Secondary | ICD-10-CM | POA: Diagnosis not present

## 2017-10-19 DIAGNOSIS — F410 Panic disorder [episodic paroxysmal anxiety] without agoraphobia: Secondary | ICD-10-CM | POA: Diagnosis not present

## 2017-11-03 DIAGNOSIS — F410 Panic disorder [episodic paroxysmal anxiety] without agoraphobia: Secondary | ICD-10-CM | POA: Diagnosis not present

## 2017-11-03 DIAGNOSIS — G8921 Chronic pain due to trauma: Secondary | ICD-10-CM | POA: Diagnosis not present

## 2017-11-03 DIAGNOSIS — M6281 Muscle weakness (generalized): Secondary | ICD-10-CM | POA: Diagnosis not present

## 2017-11-09 DIAGNOSIS — F410 Panic disorder [episodic paroxysmal anxiety] without agoraphobia: Secondary | ICD-10-CM | POA: Diagnosis not present

## 2017-11-11 DIAGNOSIS — Z713 Dietary counseling and surveillance: Secondary | ICD-10-CM | POA: Diagnosis not present

## 2017-11-12 DIAGNOSIS — Q278 Other specified congenital malformations of peripheral vascular system: Secondary | ICD-10-CM | POA: Diagnosis not present

## 2017-11-12 DIAGNOSIS — F4310 Post-traumatic stress disorder, unspecified: Secondary | ICD-10-CM | POA: Diagnosis not present

## 2017-11-12 DIAGNOSIS — D649 Anemia, unspecified: Secondary | ICD-10-CM | POA: Diagnosis not present

## 2017-11-12 DIAGNOSIS — K219 Gastro-esophageal reflux disease without esophagitis: Secondary | ICD-10-CM | POA: Diagnosis not present

## 2017-11-12 DIAGNOSIS — F902 Attention-deficit hyperactivity disorder, combined type: Secondary | ICD-10-CM | POA: Diagnosis not present

## 2017-11-13 DIAGNOSIS — F410 Panic disorder [episodic paroxysmal anxiety] without agoraphobia: Secondary | ICD-10-CM | POA: Diagnosis not present

## 2017-11-16 DIAGNOSIS — F410 Panic disorder [episodic paroxysmal anxiety] without agoraphobia: Secondary | ICD-10-CM | POA: Diagnosis not present

## 2017-11-18 DIAGNOSIS — Z713 Dietary counseling and surveillance: Secondary | ICD-10-CM | POA: Diagnosis not present

## 2017-11-20 DIAGNOSIS — F410 Panic disorder [episodic paroxysmal anxiety] without agoraphobia: Secondary | ICD-10-CM | POA: Diagnosis not present

## 2017-11-23 DIAGNOSIS — F410 Panic disorder [episodic paroxysmal anxiety] without agoraphobia: Secondary | ICD-10-CM | POA: Diagnosis not present

## 2017-11-25 DIAGNOSIS — Z713 Dietary counseling and surveillance: Secondary | ICD-10-CM | POA: Diagnosis not present

## 2017-11-27 DIAGNOSIS — F410 Panic disorder [episodic paroxysmal anxiety] without agoraphobia: Secondary | ICD-10-CM | POA: Diagnosis not present

## 2017-11-30 DIAGNOSIS — F410 Panic disorder [episodic paroxysmal anxiety] without agoraphobia: Secondary | ICD-10-CM | POA: Diagnosis not present

## 2017-12-02 DIAGNOSIS — Z319 Encounter for procreative management, unspecified: Secondary | ICD-10-CM | POA: Diagnosis not present

## 2017-12-02 DIAGNOSIS — Z713 Dietary counseling and surveillance: Secondary | ICD-10-CM | POA: Diagnosis not present

## 2017-12-08 DIAGNOSIS — F410 Panic disorder [episodic paroxysmal anxiety] without agoraphobia: Secondary | ICD-10-CM | POA: Diagnosis not present

## 2017-12-09 DIAGNOSIS — Z713 Dietary counseling and surveillance: Secondary | ICD-10-CM | POA: Diagnosis not present

## 2017-12-11 DIAGNOSIS — F410 Panic disorder [episodic paroxysmal anxiety] without agoraphobia: Secondary | ICD-10-CM | POA: Diagnosis not present

## 2017-12-14 DIAGNOSIS — F410 Panic disorder [episodic paroxysmal anxiety] without agoraphobia: Secondary | ICD-10-CM | POA: Diagnosis not present

## 2017-12-16 DIAGNOSIS — Z713 Dietary counseling and surveillance: Secondary | ICD-10-CM | POA: Diagnosis not present

## 2017-12-21 DIAGNOSIS — F410 Panic disorder [episodic paroxysmal anxiety] without agoraphobia: Secondary | ICD-10-CM | POA: Diagnosis not present

## 2017-12-23 DIAGNOSIS — Z319 Encounter for procreative management, unspecified: Secondary | ICD-10-CM | POA: Diagnosis not present

## 2017-12-25 DIAGNOSIS — F410 Panic disorder [episodic paroxysmal anxiety] without agoraphobia: Secondary | ICD-10-CM | POA: Diagnosis not present

## 2017-12-29 DIAGNOSIS — F410 Panic disorder [episodic paroxysmal anxiety] without agoraphobia: Secondary | ICD-10-CM | POA: Diagnosis not present

## 2017-12-30 DIAGNOSIS — Z713 Dietary counseling and surveillance: Secondary | ICD-10-CM | POA: Diagnosis not present

## 2018-01-01 DIAGNOSIS — F410 Panic disorder [episodic paroxysmal anxiety] without agoraphobia: Secondary | ICD-10-CM | POA: Diagnosis not present

## 2018-01-04 DIAGNOSIS — F410 Panic disorder [episodic paroxysmal anxiety] without agoraphobia: Secondary | ICD-10-CM | POA: Diagnosis not present

## 2018-01-06 DIAGNOSIS — Z713 Dietary counseling and surveillance: Secondary | ICD-10-CM | POA: Diagnosis not present

## 2018-01-08 DIAGNOSIS — J3081 Allergic rhinitis due to animal (cat) (dog) hair and dander: Secondary | ICD-10-CM | POA: Diagnosis not present

## 2018-01-08 DIAGNOSIS — J301 Allergic rhinitis due to pollen: Secondary | ICD-10-CM | POA: Diagnosis not present

## 2018-01-08 DIAGNOSIS — J3089 Other allergic rhinitis: Secondary | ICD-10-CM | POA: Diagnosis not present

## 2018-01-11 DIAGNOSIS — M545 Low back pain: Secondary | ICD-10-CM | POA: Diagnosis not present

## 2018-01-11 DIAGNOSIS — M791 Myalgia, unspecified site: Secondary | ICD-10-CM | POA: Diagnosis not present

## 2018-01-13 DIAGNOSIS — Z713 Dietary counseling and surveillance: Secondary | ICD-10-CM | POA: Diagnosis not present

## 2018-01-14 DIAGNOSIS — F410 Panic disorder [episodic paroxysmal anxiety] without agoraphobia: Secondary | ICD-10-CM | POA: Diagnosis not present

## 2018-01-20 DIAGNOSIS — Z713 Dietary counseling and surveillance: Secondary | ICD-10-CM | POA: Diagnosis not present

## 2018-01-21 DIAGNOSIS — F410 Panic disorder [episodic paroxysmal anxiety] without agoraphobia: Secondary | ICD-10-CM | POA: Diagnosis not present

## 2018-01-22 DIAGNOSIS — F900 Attention-deficit hyperactivity disorder, predominantly inattentive type: Secondary | ICD-10-CM | POA: Diagnosis not present

## 2018-01-22 DIAGNOSIS — F3342 Major depressive disorder, recurrent, in full remission: Secondary | ICD-10-CM | POA: Diagnosis not present

## 2018-01-27 DIAGNOSIS — Z713 Dietary counseling and surveillance: Secondary | ICD-10-CM | POA: Diagnosis not present

## 2018-01-29 ENCOUNTER — Encounter: Payer: BLUE CROSS/BLUE SHIELD | Admitting: Vascular Surgery

## 2018-01-29 DIAGNOSIS — F410 Panic disorder [episodic paroxysmal anxiety] without agoraphobia: Secondary | ICD-10-CM | POA: Diagnosis not present

## 2018-02-03 DIAGNOSIS — Z713 Dietary counseling and surveillance: Secondary | ICD-10-CM | POA: Diagnosis not present

## 2018-02-05 DIAGNOSIS — F410 Panic disorder [episodic paroxysmal anxiety] without agoraphobia: Secondary | ICD-10-CM | POA: Diagnosis not present

## 2018-02-09 ENCOUNTER — Encounter: Payer: Self-pay | Admitting: Vascular Surgery

## 2018-02-10 DIAGNOSIS — Z713 Dietary counseling and surveillance: Secondary | ICD-10-CM | POA: Diagnosis not present

## 2018-02-11 DIAGNOSIS — F410 Panic disorder [episodic paroxysmal anxiety] without agoraphobia: Secondary | ICD-10-CM | POA: Diagnosis not present

## 2018-02-15 IMAGING — CR DG CHEST 2V
2 series · 2 of 2 positions shown · non-contrast
Comparison: No prior.

CLINICAL DATA: Asthma.

EXAM:
CHEST  2 VIEW

[w chest pa]
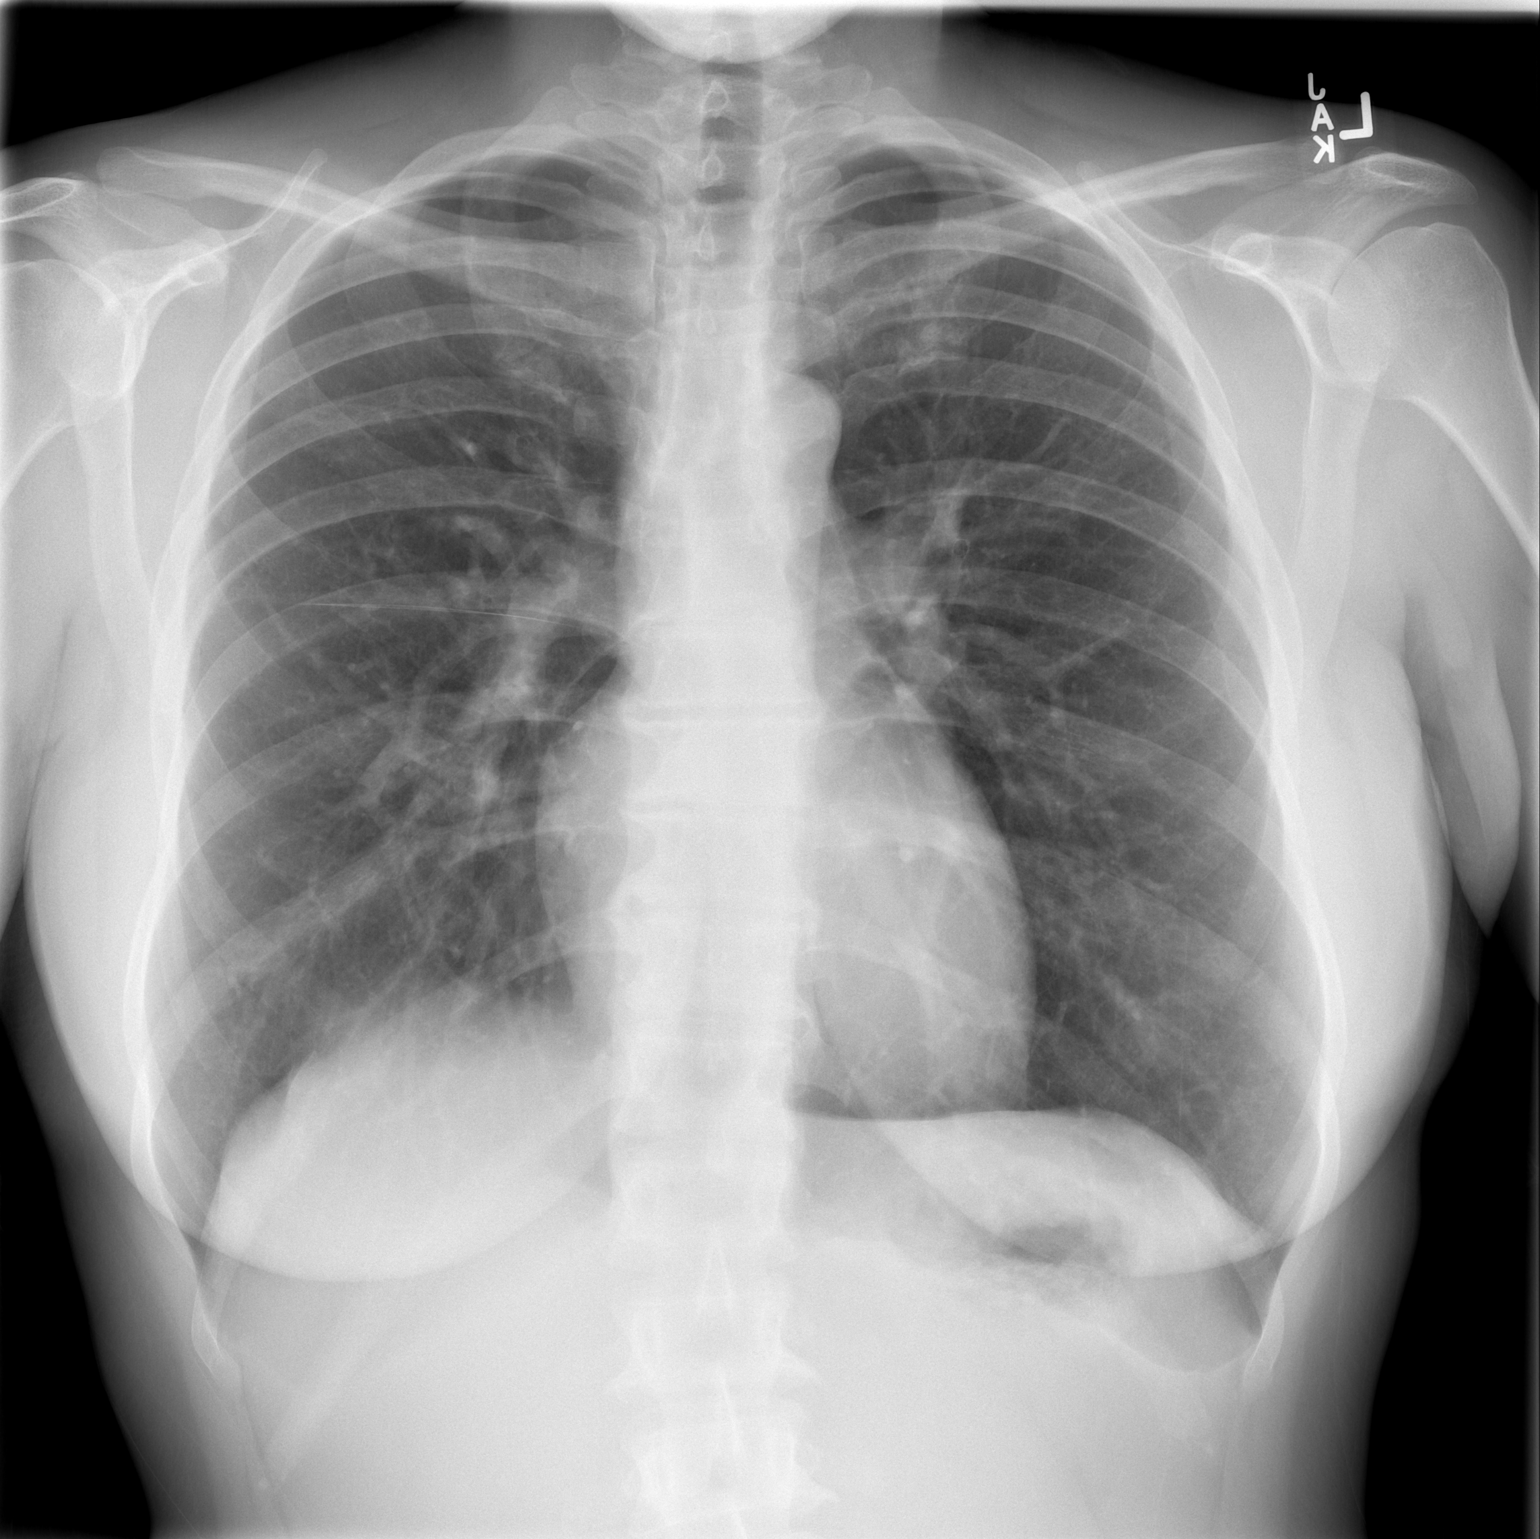

[w chest lat]
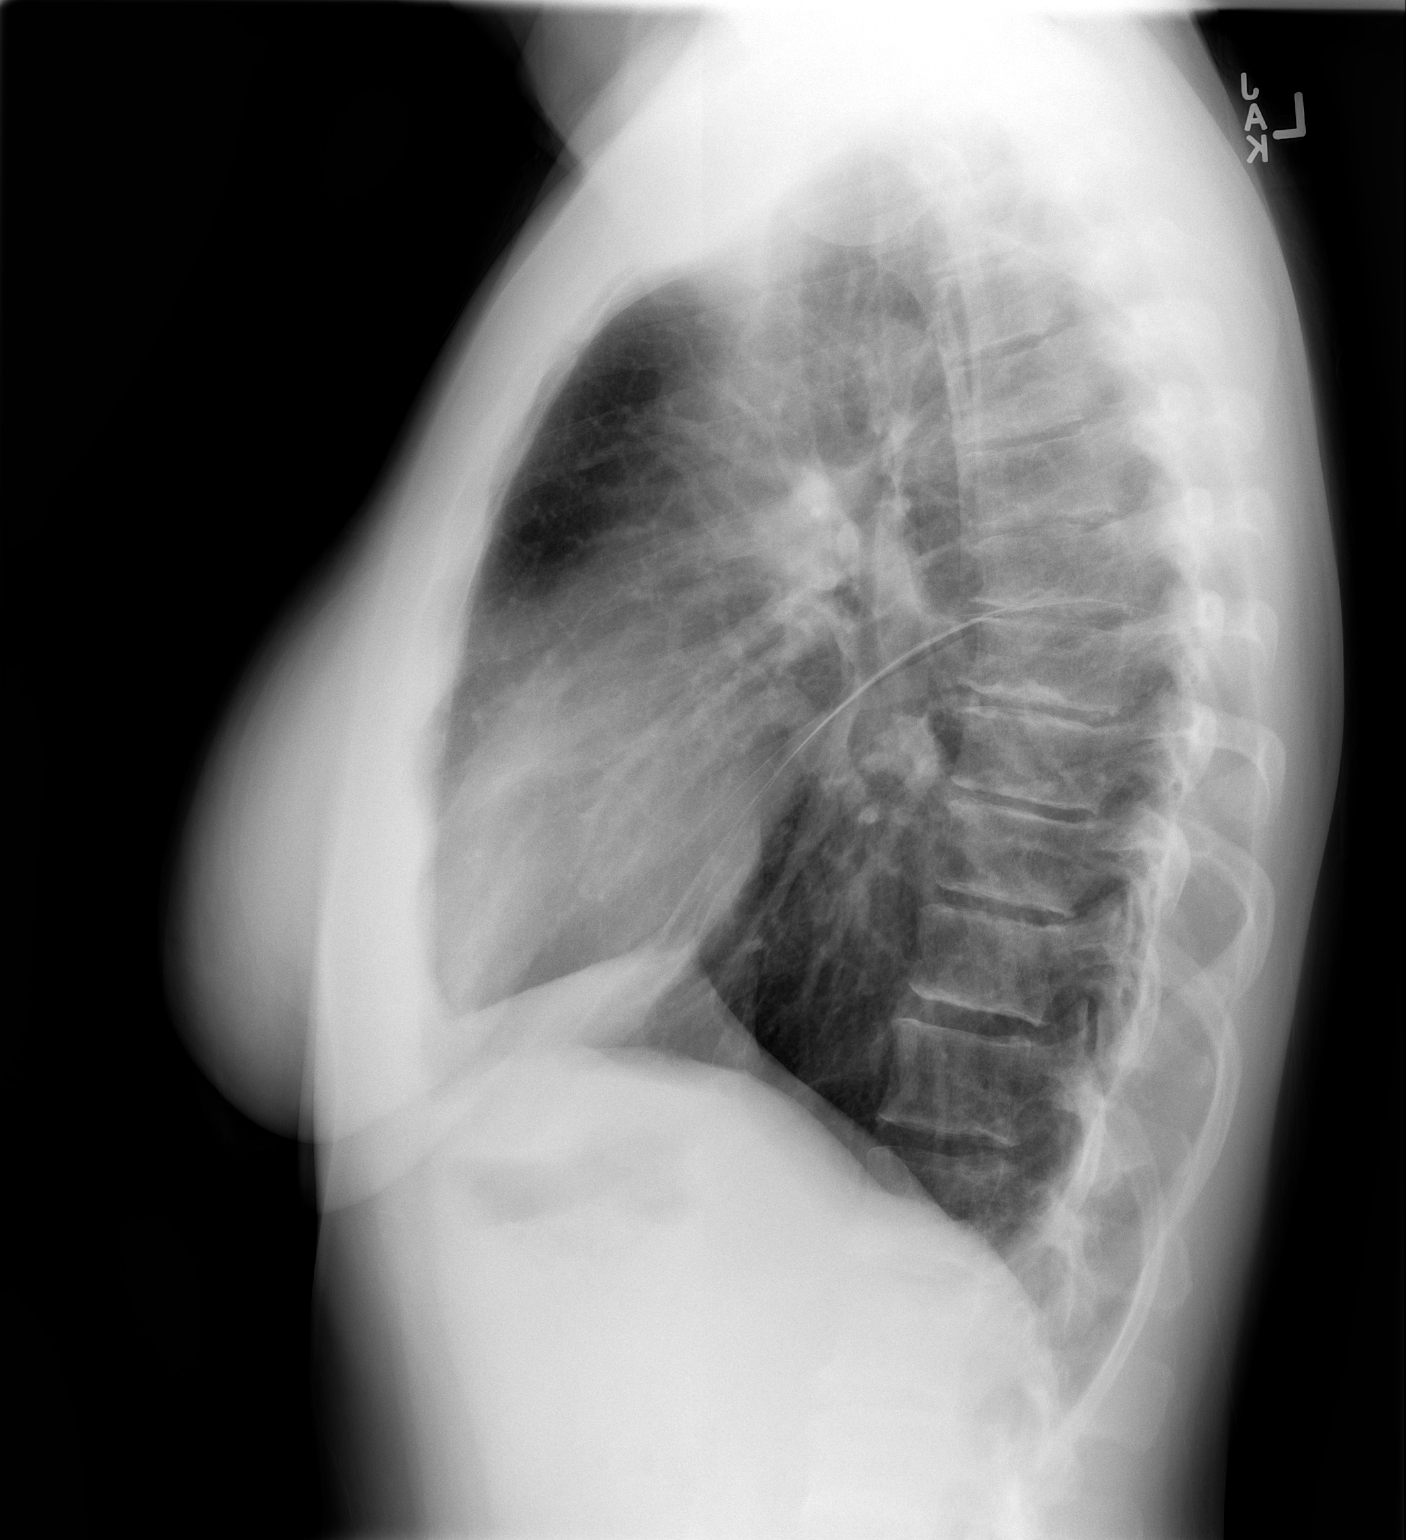

[2 of 2 positions shown; findings below may reference images not displayed]

FINDINGS: Mediastinum and hilar structures are normal. Lungs are clear. Heart
size normal. No pleural effusion or pneumothorax.
IMPRESSION: No acute cardiopulmonary disease.

## 2018-02-16 DIAGNOSIS — F410 Panic disorder [episodic paroxysmal anxiety] without agoraphobia: Secondary | ICD-10-CM | POA: Diagnosis not present

## 2018-02-17 DIAGNOSIS — Z713 Dietary counseling and surveillance: Secondary | ICD-10-CM | POA: Diagnosis not present

## 2018-02-19 DIAGNOSIS — F410 Panic disorder [episodic paroxysmal anxiety] without agoraphobia: Secondary | ICD-10-CM | POA: Diagnosis not present

## 2018-02-22 DIAGNOSIS — F410 Panic disorder [episodic paroxysmal anxiety] without agoraphobia: Secondary | ICD-10-CM | POA: Diagnosis not present

## 2018-02-25 DIAGNOSIS — F410 Panic disorder [episodic paroxysmal anxiety] without agoraphobia: Secondary | ICD-10-CM | POA: Diagnosis not present

## 2018-02-26 DIAGNOSIS — F3342 Major depressive disorder, recurrent, in full remission: Secondary | ICD-10-CM | POA: Diagnosis not present

## 2018-02-26 DIAGNOSIS — F4310 Post-traumatic stress disorder, unspecified: Secondary | ICD-10-CM | POA: Diagnosis not present

## 2018-03-03 DIAGNOSIS — Z713 Dietary counseling and surveillance: Secondary | ICD-10-CM | POA: Diagnosis not present

## 2018-03-09 DIAGNOSIS — F410 Panic disorder [episodic paroxysmal anxiety] without agoraphobia: Secondary | ICD-10-CM | POA: Diagnosis not present

## 2018-03-16 DIAGNOSIS — F410 Panic disorder [episodic paroxysmal anxiety] without agoraphobia: Secondary | ICD-10-CM | POA: Diagnosis not present

## 2018-03-17 DIAGNOSIS — Z713 Dietary counseling and surveillance: Secondary | ICD-10-CM | POA: Diagnosis not present

## 2018-03-18 DIAGNOSIS — F410 Panic disorder [episodic paroxysmal anxiety] without agoraphobia: Secondary | ICD-10-CM | POA: Diagnosis not present

## 2018-03-22 DIAGNOSIS — Z319 Encounter for procreative management, unspecified: Secondary | ICD-10-CM | POA: Diagnosis not present

## 2018-03-23 DIAGNOSIS — F410 Panic disorder [episodic paroxysmal anxiety] without agoraphobia: Secondary | ICD-10-CM | POA: Diagnosis not present

## 2018-04-01 DIAGNOSIS — F410 Panic disorder [episodic paroxysmal anxiety] without agoraphobia: Secondary | ICD-10-CM | POA: Diagnosis not present

## 2018-04-09 DIAGNOSIS — F410 Panic disorder [episodic paroxysmal anxiety] without agoraphobia: Secondary | ICD-10-CM | POA: Diagnosis not present

## 2018-04-12 DIAGNOSIS — F410 Panic disorder [episodic paroxysmal anxiety] without agoraphobia: Secondary | ICD-10-CM | POA: Diagnosis not present

## 2018-04-16 DIAGNOSIS — F410 Panic disorder [episodic paroxysmal anxiety] without agoraphobia: Secondary | ICD-10-CM | POA: Diagnosis not present

## 2018-04-19 DIAGNOSIS — F410 Panic disorder [episodic paroxysmal anxiety] without agoraphobia: Secondary | ICD-10-CM | POA: Diagnosis not present

## 2018-04-26 DIAGNOSIS — F410 Panic disorder [episodic paroxysmal anxiety] without agoraphobia: Secondary | ICD-10-CM | POA: Diagnosis not present

## 2018-04-29 DIAGNOSIS — F410 Panic disorder [episodic paroxysmal anxiety] without agoraphobia: Secondary | ICD-10-CM | POA: Diagnosis not present

## 2018-05-03 DIAGNOSIS — Z6829 Body mass index (BMI) 29.0-29.9, adult: Secondary | ICD-10-CM | POA: Diagnosis not present

## 2018-05-03 DIAGNOSIS — M791 Myalgia, unspecified site: Secondary | ICD-10-CM | POA: Diagnosis not present

## 2018-05-03 DIAGNOSIS — M545 Low back pain: Secondary | ICD-10-CM | POA: Diagnosis not present

## 2018-05-03 DIAGNOSIS — M5136 Other intervertebral disc degeneration, lumbar region: Secondary | ICD-10-CM | POA: Diagnosis not present

## 2018-05-04 DIAGNOSIS — F410 Panic disorder [episodic paroxysmal anxiety] without agoraphobia: Secondary | ICD-10-CM | POA: Diagnosis not present

## 2018-05-04 DIAGNOSIS — F4310 Post-traumatic stress disorder, unspecified: Secondary | ICD-10-CM | POA: Diagnosis not present

## 2018-05-04 DIAGNOSIS — F900 Attention-deficit hyperactivity disorder, predominantly inattentive type: Secondary | ICD-10-CM | POA: Diagnosis not present

## 2018-05-05 DIAGNOSIS — F410 Panic disorder [episodic paroxysmal anxiety] without agoraphobia: Secondary | ICD-10-CM | POA: Diagnosis not present

## 2018-05-13 DIAGNOSIS — F410 Panic disorder [episodic paroxysmal anxiety] without agoraphobia: Secondary | ICD-10-CM | POA: Diagnosis not present

## 2018-05-18 DIAGNOSIS — M545 Low back pain: Secondary | ICD-10-CM | POA: Diagnosis not present

## 2018-05-18 DIAGNOSIS — G8921 Chronic pain due to trauma: Secondary | ICD-10-CM | POA: Diagnosis not present

## 2018-05-18 DIAGNOSIS — M6281 Muscle weakness (generalized): Secondary | ICD-10-CM | POA: Diagnosis not present

## 2018-05-18 DIAGNOSIS — M25571 Pain in right ankle and joints of right foot: Secondary | ICD-10-CM | POA: Diagnosis not present

## 2018-05-19 DIAGNOSIS — Z713 Dietary counseling and surveillance: Secondary | ICD-10-CM | POA: Diagnosis not present

## 2018-05-22 IMAGING — MR MR ANKLE*L* W/O CM
5 of 6 series · 28 of 40 positions shown · non-contrast
Comparison: MRI of the left ankle 01/21/2013.

CLINICAL DATA: Chronic left ankle pain which is worse on the
lateral side. No recent injury.

EXAM:
MRI OF THE LEFT ANKLE WITHOUT CONTRAST
TECHNIQUE: Multiplanar, multisequence MR imaging of the ankle was performed. No
intravenous contrast was administered.

[Series 5: T2 fat-sat · coronal · 4.0mm · 0.33mm/px · 6 of 24 slices shown (1 of 3)]
[im 1/24]
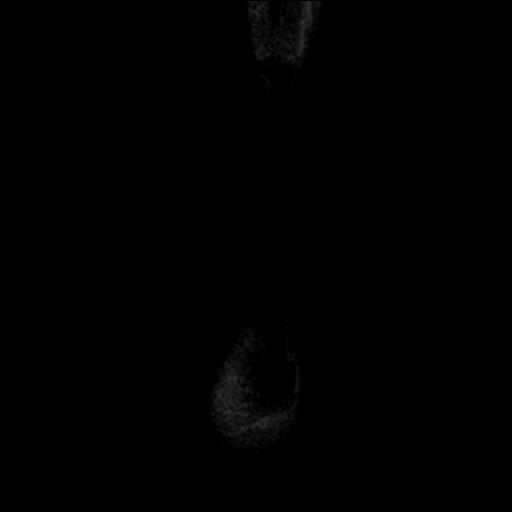
[im 5/24]
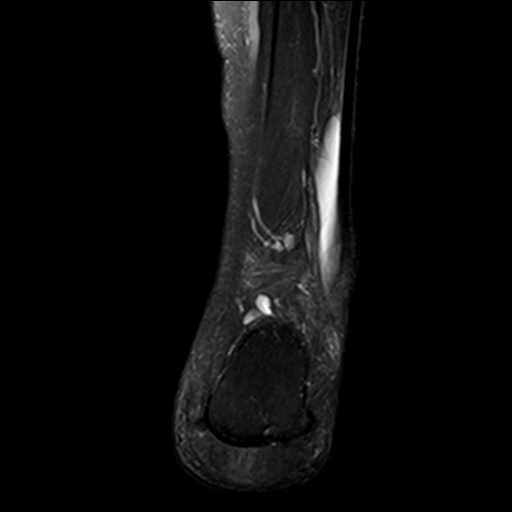
[im 10/24]
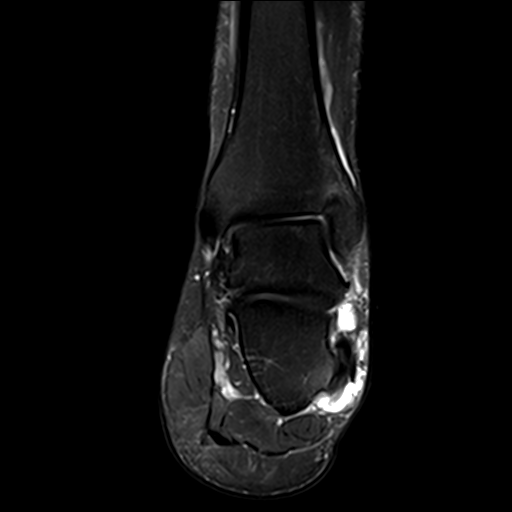
[im 14/24]
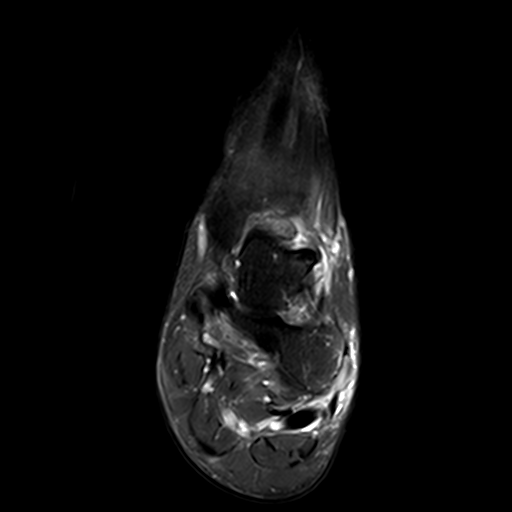
[im 19/24]
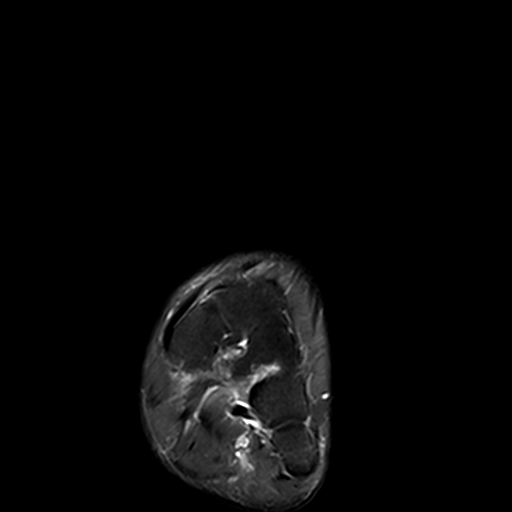
[im 24/24]
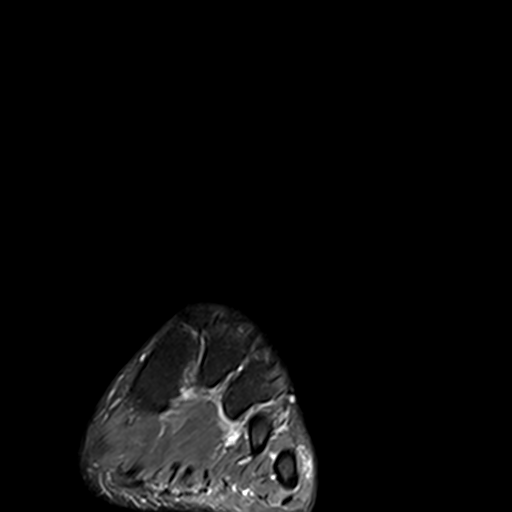

[Series 6: T2 fat-sat · axial · 4.0mm · 0.53mm/px · z∈[-95,+20]mm · 7 of 24 slices shown (2 of 3)]
[im 1/24]
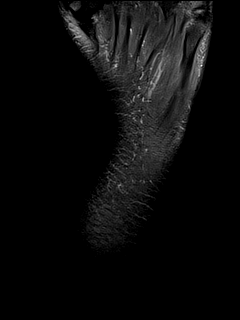
[im 4/24]
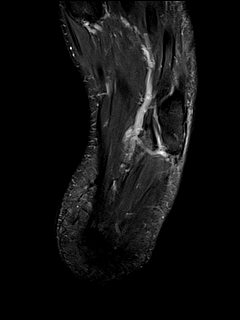
[im 8/24]
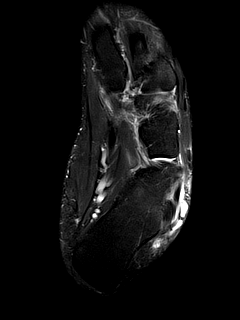
[im 12/24]
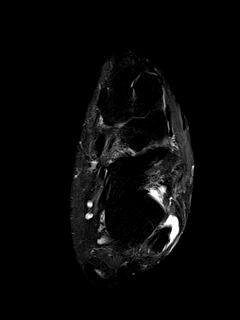
[im 16/24]
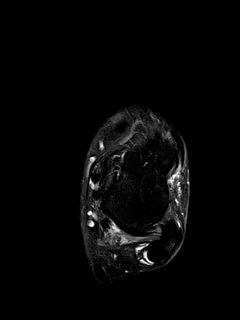
[im 20/24]
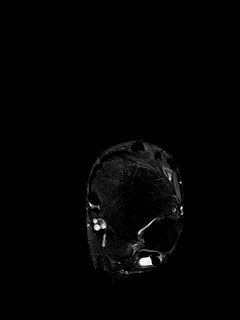
[im 24/24]
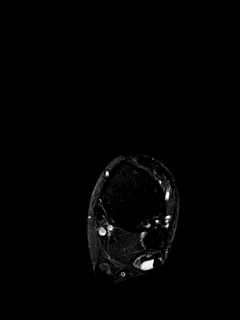

[Series 7: PD fat-sat · axial · 3.5mm · 0.53mm/px · z∈[-94,+19]mm · 8 of 27 slices shown]
[im 1/27]
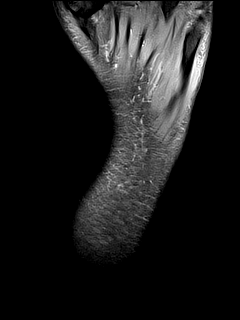
[im 4/27]
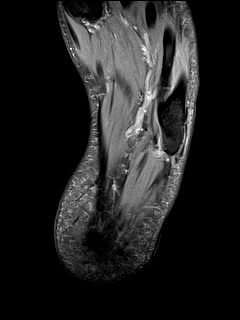
[im 8/27]
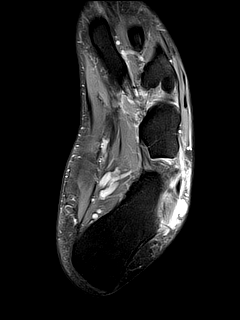
[im 12/27]
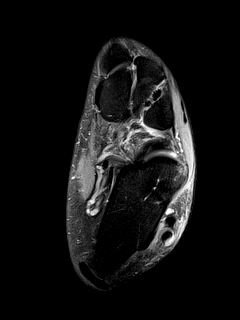
[im 15/27]
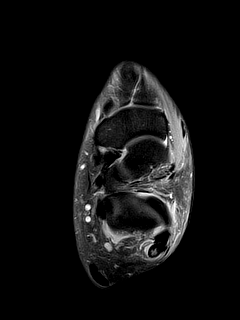
[im 19/27]
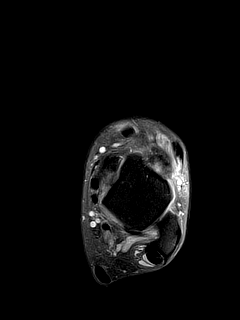
[im 23/27]
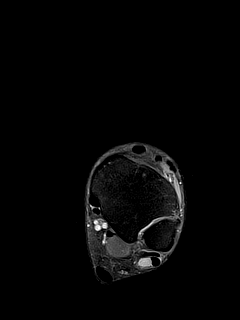
[im 27/27]
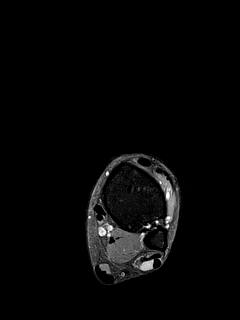

[Series 8: T1 · sagittal · 3.0mm · 0.27mm/px · 1 of 19 slices shown]
[im 1/19]
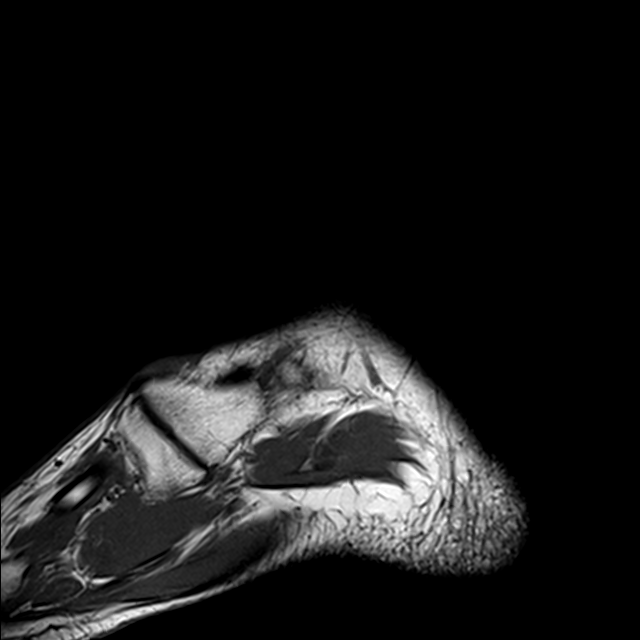

[Series 9: T2 fat-sat · sagittal · 3.0mm · 0.33mm/px · 6 of 19 slices shown (3 of 3)]
[im 1/19]
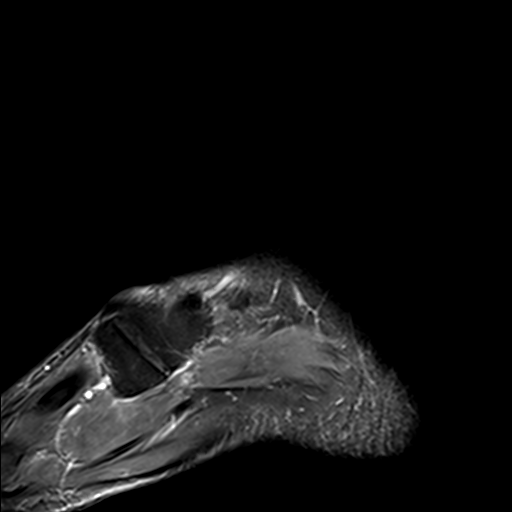
[im 4/19]
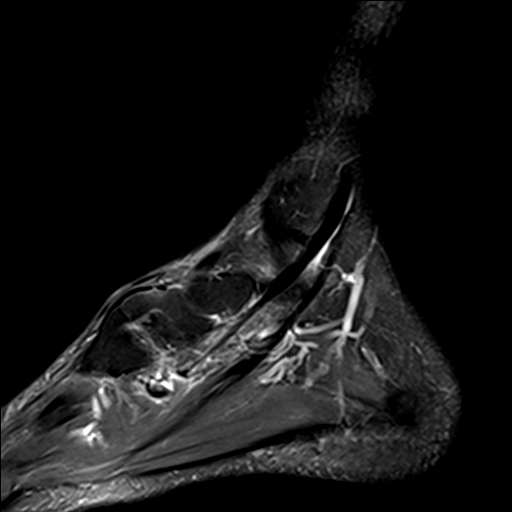
[im 8/19]
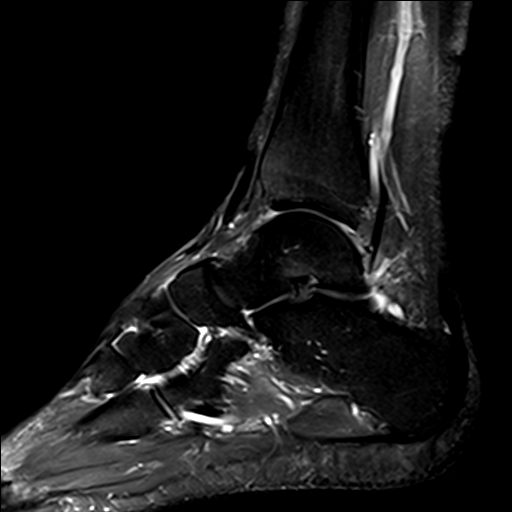
[im 11/19]
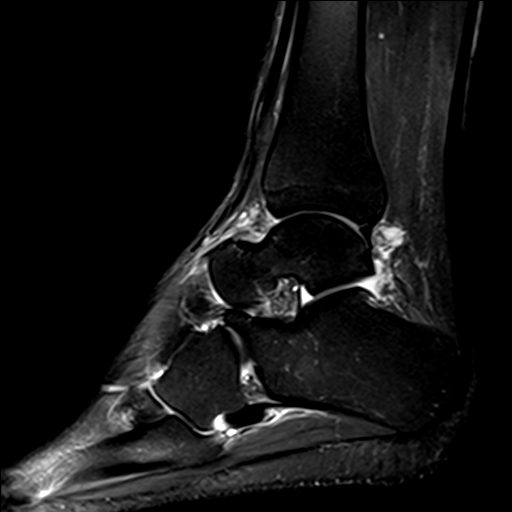
[im 15/19]
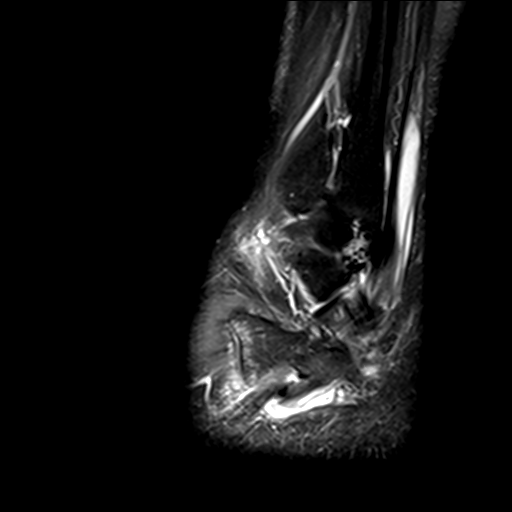
[im 19/19]
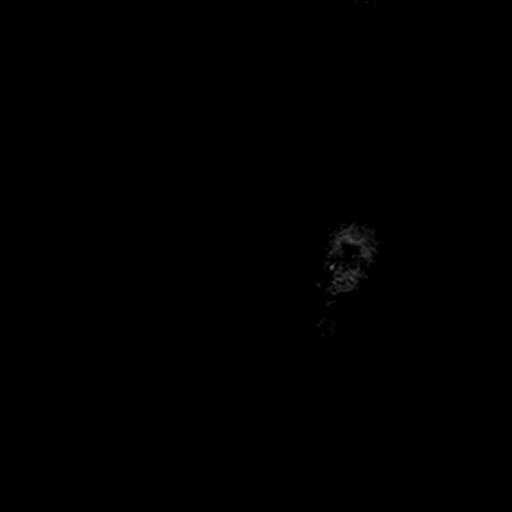

[28 of 40 positions shown; findings below may reference images not displayed]

FINDINGS: TENDONS

Peroneal: There is a large volume of fluid in the sheaths of the
peroneal tendons consistent with tenosynovitis. Intrasubstance
increased T2 signal is seen in the peroneus longus at and just
distal to the lateral malleolus is seen. Both tendons appear intact.

Posteromedial: Intact.

Anterior: Intact.

Achilles: Intact.

Plantar Fascia: Appears normal.

LIGAMENTS

Lateral: As on the prior MRI, the anterior talofibular ligament is
not visualized compatible with chronic tear. Lateral ligaments are
otherwise intact.

Medial: Intact.

CARTILAGE

Ankle Joint: Appears normal.

Subtalar Joints/Sinus Tarsi: Small volume of fluid extending
posteriorly out of the posterior facet of the subtalar joint is
incidentally noted.

Bones: New mild subchondral edema is seen in the periphery of the
calcaneus at the calcaneocuboid joint consistent with
osteoarthritis. No fracture is identified. Mild edema is seen in the
proximal fourth and fifth metatarsals without fracture.

Other: None.
IMPRESSION: Marked peroneus longus and brevis tendinosis has progressed since
the prior MRI. New intrasubstance increased T2 signal is seen in the
peroneus longus tendon about the lateral malleolus but no tear is
identified.

Mild edema in the proximal fourth and fifth metatarsals is
consistent with stress change without fracture.

Nonvisualization of the anterior talofibular ligament most
consistent with chronic tear as seen on the prior MRI.

New mild/early degenerative change of the calcaneocuboid joint.

## 2018-05-24 DIAGNOSIS — F410 Panic disorder [episodic paroxysmal anxiety] without agoraphobia: Secondary | ICD-10-CM | POA: Diagnosis not present

## 2018-05-28 DIAGNOSIS — F410 Panic disorder [episodic paroxysmal anxiety] without agoraphobia: Secondary | ICD-10-CM | POA: Diagnosis not present

## 2018-06-08 DIAGNOSIS — F410 Panic disorder [episodic paroxysmal anxiety] without agoraphobia: Secondary | ICD-10-CM | POA: Diagnosis not present

## 2018-06-09 DIAGNOSIS — Z713 Dietary counseling and surveillance: Secondary | ICD-10-CM | POA: Diagnosis not present

## 2018-06-15 DIAGNOSIS — F410 Panic disorder [episodic paroxysmal anxiety] without agoraphobia: Secondary | ICD-10-CM | POA: Diagnosis not present

## 2018-06-24 DIAGNOSIS — F410 Panic disorder [episodic paroxysmal anxiety] without agoraphobia: Secondary | ICD-10-CM | POA: Diagnosis not present

## 2018-06-30 DIAGNOSIS — Z713 Dietary counseling and surveillance: Secondary | ICD-10-CM | POA: Diagnosis not present

## 2018-07-01 DIAGNOSIS — F410 Panic disorder [episodic paroxysmal anxiety] without agoraphobia: Secondary | ICD-10-CM | POA: Diagnosis not present

## 2018-07-08 DIAGNOSIS — F3342 Major depressive disorder, recurrent, in full remission: Secondary | ICD-10-CM | POA: Diagnosis not present

## 2018-07-08 DIAGNOSIS — F4310 Post-traumatic stress disorder, unspecified: Secondary | ICD-10-CM | POA: Diagnosis not present

## 2018-07-08 DIAGNOSIS — F410 Panic disorder [episodic paroxysmal anxiety] without agoraphobia: Secondary | ICD-10-CM | POA: Diagnosis not present

## 2018-07-27 DIAGNOSIS — F410 Panic disorder [episodic paroxysmal anxiety] without agoraphobia: Secondary | ICD-10-CM | POA: Diagnosis not present

## 2018-08-03 DIAGNOSIS — F410 Panic disorder [episodic paroxysmal anxiety] without agoraphobia: Secondary | ICD-10-CM | POA: Diagnosis not present

## 2018-08-10 DIAGNOSIS — M5136 Other intervertebral disc degeneration, lumbar region: Secondary | ICD-10-CM | POA: Diagnosis not present

## 2018-08-10 DIAGNOSIS — M545 Low back pain: Secondary | ICD-10-CM | POA: Diagnosis not present

## 2018-08-10 DIAGNOSIS — F410 Panic disorder [episodic paroxysmal anxiety] without agoraphobia: Secondary | ICD-10-CM | POA: Diagnosis not present

## 2018-08-10 DIAGNOSIS — M791 Myalgia, unspecified site: Secondary | ICD-10-CM | POA: Diagnosis not present

## 2018-08-13 DIAGNOSIS — F410 Panic disorder [episodic paroxysmal anxiety] without agoraphobia: Secondary | ICD-10-CM | POA: Diagnosis not present

## 2018-08-25 DIAGNOSIS — F410 Panic disorder [episodic paroxysmal anxiety] without agoraphobia: Secondary | ICD-10-CM | POA: Diagnosis not present

## 2018-09-01 DIAGNOSIS — F410 Panic disorder [episodic paroxysmal anxiety] without agoraphobia: Secondary | ICD-10-CM | POA: Diagnosis not present

## 2018-09-09 DIAGNOSIS — Z03818 Encounter for observation for suspected exposure to other biological agents ruled out: Secondary | ICD-10-CM | POA: Diagnosis not present

## 2018-09-10 DIAGNOSIS — F410 Panic disorder [episodic paroxysmal anxiety] without agoraphobia: Secondary | ICD-10-CM | POA: Diagnosis not present

## 2018-09-13 ENCOUNTER — Other Ambulatory Visit: Payer: Self-pay

## 2018-09-13 ENCOUNTER — Ambulatory Visit: Payer: BLUE CROSS/BLUE SHIELD | Admitting: Allergy and Immunology

## 2018-09-13 ENCOUNTER — Ambulatory Visit (INDEPENDENT_AMBULATORY_CARE_PROVIDER_SITE_OTHER): Payer: BLUE CROSS/BLUE SHIELD | Admitting: Allergy and Immunology

## 2018-09-13 ENCOUNTER — Ambulatory Visit: Payer: Self-pay | Admitting: Pediatrics

## 2018-09-13 ENCOUNTER — Encounter: Payer: Self-pay | Admitting: Allergy and Immunology

## 2018-09-13 VITALS — BP 164/96 | HR 113 | Resp 20 | Ht 71.0 in | Wt 195.2 lb

## 2018-09-13 DIAGNOSIS — K219 Gastro-esophageal reflux disease without esophagitis: Secondary | ICD-10-CM | POA: Diagnosis not present

## 2018-09-13 DIAGNOSIS — J3089 Other allergic rhinitis: Secondary | ICD-10-CM | POA: Diagnosis not present

## 2018-09-13 DIAGNOSIS — B349 Viral infection, unspecified: Secondary | ICD-10-CM | POA: Diagnosis not present

## 2018-09-13 DIAGNOSIS — H1013 Acute atopic conjunctivitis, bilateral: Secondary | ICD-10-CM | POA: Diagnosis not present

## 2018-09-13 DIAGNOSIS — J4520 Mild intermittent asthma, uncomplicated: Secondary | ICD-10-CM | POA: Diagnosis not present

## 2018-09-13 DIAGNOSIS — J302 Other seasonal allergic rhinitis: Secondary | ICD-10-CM | POA: Insufficient documentation

## 2018-09-13 DIAGNOSIS — J4540 Moderate persistent asthma, uncomplicated: Secondary | ICD-10-CM

## 2018-09-13 DIAGNOSIS — H1010 Acute atopic conjunctivitis, unspecified eye: Secondary | ICD-10-CM | POA: Insufficient documentation

## 2018-09-13 MED ORDER — SYMBICORT 160-4.5 MCG/ACT IN AERO: INHALATION_SPRAY | RESPIRATORY_TRACT | 5 refills | Status: DC

## 2018-09-13 MED ORDER — FAMOTIDINE 20 MG PO TABS: 20.0000 mg | ORAL_TABLET | Freq: Two times a day (BID) | ORAL | 5 refills | Status: DC

## 2018-09-13 MED ORDER — OMEPRAZOLE 20 MG PO CPDR: 20.0000 mg | DELAYED_RELEASE_CAPSULE | Freq: Every day | ORAL | 5 refills | Status: DC

## 2018-09-13 MED ORDER — OLOPATADINE HCL 0.1 % OP SOLN: 1.0000 [drp] | Freq: Two times a day (BID) | OPHTHALMIC | 5 refills | Status: DC

## 2018-09-13 MED ORDER — LEVOCETIRIZINE DIHYDROCHLORIDE 5 MG PO TABS: 5.0000 mg | ORAL_TABLET | Freq: Every evening | ORAL | 5 refills | Status: DC

## 2018-09-13 NOTE — Assessment & Plan Note (Addendum)
   Aeroallergen avoidance measures have been discussed and provided in written form.  A prescription has been provided for levocetirizine, 5 mg daily as needed.  A prescription has been provided for azelastine nasal spray, 1-2 sprays per nostril 2 times daily as needed. Proper nasal spray technique has been discussed and demonstrated.   Nasal saline spray (i.e., Simply Saline) or nasal saline lavage (i.e., NeilMed) is recommended as needed and prior to medicated nasal sprays.  The risks and benefits of aeroallergen immunotherapy have been discussed. The patient is motivated to initiate immunotherapy to reduce symptoms and decrease medication requirement. Informed consent has been signed and allergen vaccine orders have been submitted. Medications will be decreased or discontinued as symptom relief from immunotherapy becomes evident.

## 2018-09-13 NOTE — Patient Instructions (Addendum)
Moderate persistent asthma Currently well controlled.  Continue Symbicort 160-4.5 g, 2 inhalations twice daily.  A new spacer device has been provided.  Due to potential side effects, discontinue montelukast (Singulair).  Continue albuterol HFA, 1 to 2 inhalations every 4-6 hours if needed.  Subjective and objective measures of pulmonary function will be followed and the treatment plan will be adjusted accordingly.  Perennial and seasonal allergic rhinitis  Aeroallergen avoidance measures have been discussed and provided in written form.  A prescription has been provided for levocetirizine, 5 mg daily as needed.  A prescription has been provided for azelastine nasal spray, 1-2 sprays per nostril 2 times daily as needed. Proper nasal spray technique has been discussed and demonstrated.   Nasal saline spray (i.e., Simply Saline) or nasal saline lavage (i.e., NeilMed) is recommended as needed and prior to medicated nasal sprays.  The risks and benefits of aeroallergen immunotherapy have been discussed. The patient is motivated to initiate immunotherapy to reduce symptoms and decrease medication requirement. Informed consent has been signed and allergen vaccine orders have been submitted. Medications will be decreased or discontinued as symptom relief from immunotherapy becomes evident.  Allergic conjunctivitis  Treatment plan as outlined above for allergic rhinitis.  A prescription has been provided for Pazeo, one drop per eye daily as needed.  I have also recommended eye lubricant drops (i.e., Natural Tears) as needed.  GERD (gastroesophageal reflux disease)  Appropriate reflux lifestyle modifications have been provided.  A prescription has been provided for omeprazole 20 mg daily, 30 minutes prior to breakfast.  If needed, add famotidine (Pepcid) 20 mg twice daily.  History of viral syndrome  A laboratory order form has been provided for COVID-19 IgG.  When lab results have  returned the patient will be called.    Return in about 3 months (around 12/14/2018), or if symptoms worsen or fail to improve.  Control of House Dust Mite Allergen  House dust mites play a major role in allergic asthma and rhinitis.  They occur in environments with high humidity wherever human skin, the food for dust mites is found. High levels have been detected in dust obtained from mattresses, pillows, carpets, upholstered furniture, bed covers, clothes and soft toys.  The principal allergen of the house dust mite is found in its feces.  A gram of dust may contain 1,000 mites and 250,000 fecal particles.  Mite antigen is easily measured in the air during house cleaning activities.    1. Encase mattresses, including the box spring, and pillow, in an air tight cover.  Seal the zipper end of the encased mattresses with wide adhesive tape. 2. Wash the bedding in water of 130 degrees Farenheit weekly.  Avoid cotton comforters/quilts and flannel bedding: the most ideal bed covering is the dacron comforter. 3. Remove all upholstered furniture from the bedroom. 4. Remove carpets, carpet padding, rugs, and non-washable window drapes from the bedroom.  Wash drapes weekly or use plastic window coverings. 5. Remove all non-washable stuffed toys from the bedroom.  Wash stuffed toys weekly. 6. Have the room cleaned frequently with a vacuum cleaner and a damp dust-mop.  The patient should not be in a room which is being cleaned and should wait 1 hour after cleaning before going into the room. 7. Close and seal all heating outlets in the bedroom.  Otherwise, the room will become filled with dust-laden air.  An electric heater can be used to heat the room. 8. Reduce indoor humidity to less than 50%.  Do not use a humidifier.  Control of Dog or Cat Allergen  Avoidance is the best way to manage a dog or cat allergy. If you have a dog or cat and are allergic to dog or cats, consider removing the dog or cat from  the home. If you have a dog or cat but don't want to find it a new home, or if your family wants a pet even though someone in the household is allergic, here are some strategies that may help keep symptoms at bay:  1. Keep the pet out of your bedroom and restrict it to only a few rooms. Be advised that keeping the dog or cat in only one room will not limit the allergens to that room. 2. Don't pet, hug or kiss the dog or cat; if you do, wash your hands with soap and water. 3. High-efficiency particulate air (HEPA) cleaners run continuously in a bedroom or living room can reduce allergen levels over time. 4. Place electrostatic material sheet in the air inlet vent in the bedroom. 5. Regular use of a high-efficiency vacuum cleaner or a central vacuum can reduce allergen levels. 6. Giving your dog or cat a bath at least once a week can reduce airborne allergen.  Reducing Pollen Exposure  The American Academy of Allergy, Asthma and Immunology suggests the following steps to reduce your exposure to pollen during allergy seasons.    1. Do not hang sheets or clothing out to dry; pollen may collect on these items. 2. Do not mow lawns or spend time around freshly cut grass; mowing stirs up pollen. 3. Keep windows closed at night.  Keep car windows closed while driving. 4. Minimize morning activities outdoors, a time when pollen counts are usually at their highest. 5. Stay indoors as much as possible when pollen counts or humidity is high and on windy days when pollen tends to remain in the air longer. 6. Use air conditioning when possible.  Many air conditioners have filters that trap the pollen spores. 7. Use a HEPA room air filter to remove pollen form the indoor air you breathe.  Control of Mold Allergen  Mold and fungi can grow on a variety of surfaces provided certain temperature and moisture conditions exist.  Outdoor molds grow on plants, decaying vegetation and soil.  The major outdoor mold,  Alternaria and Cladosporium, are found in very high numbers during hot and dry conditions.  Generally, a late Summer - Fall peak is seen for common outdoor fungal spores.  Rain will temporarily lower outdoor mold spore count, but counts rise rapidly when the rainy period ends.  The most important indoor molds are Aspergillus and Penicillium.  Dark, humid and poorly ventilated basements are ideal sites for mold growth.  The next most common sites of mold growth are the bathroom and the kitchen.  Outdoor [employer] 1. Use air conditioning and keep windows closed 2. Avoid exposure to decaying vegetation. 3. Avoid leaf raking. 4. Avoid grain handling. 5. Consider wearing a face mask if working in moldy areas.  Indoor Mold Control 1. Maintain humidity below 50%. 2. Clean washable surfaces with 5% bleach solution. 3. Remove sources e.g. Contaminated carpets.    Lifestyle Changes for Controlling GERD  When you have GERD, stomach acid feels as if it's backing up toward your mouth. Whether or not you take medication to control your GERD, your symptoms can often be improved with lifestyle changes.   Raise Your Head  Reflux is more likely  to strike when you're lying down flat, because stomach fluid can  flow backward more easily. Raising the head of your bed 4-6 inches can help. To do this:  Slide blocks or books under the legs at the head of your bed. Or, place a wedge under  the mattress. Many foam stores can make a suitable wedge for you. The wedge  should run from your waist to the top of your head.  Don't just prop your head on several pillows. This increases pressure on your  stomach. It can make GERD worse.  Watch Your Eating Habits Certain foods may increase the acid in your stomach or relax the lower esophageal sphincter, making GERD more likely. It's best to avoid the following:  Coffee, tea, and carbonated drinks (with and without caffeine)  Fatty, fried, or spicy  food  Mint, chocolate, onions, and tomatoes  Any other foods that seem to irritate your stomach or cause you pain  Relieve the Pressure  Eat smaller meals, even if you have to eat more often.  Don't lie down right after you eat. Wait a few hours for your stomach to empty.  Avoid tight belts and tight-fitting clothes.  Lose excess weight.  Tobacco and Alcohol  Avoid smoking tobacco and drinking alcohol. They can make GERD symptoms worse.

## 2018-09-13 NOTE — Assessment & Plan Note (Signed)
Currently well controlled.  Continue Symbicort 160-4.5 g, 2 inhalations twice daily.  A new spacer device has been provided.  Due to potential side effects, discontinue montelukast (Singulair).  Continue albuterol HFA, 1 to 2 inhalations every 4-6 hours if needed.  Subjective and objective measures of pulmonary function will be followed and the treatment plan will be adjusted accordingly.

## 2018-09-13 NOTE — Assessment & Plan Note (Signed)
   A laboratory order form has been provided for COVID-19 IgG.  When lab results have returned the patient will be called.

## 2018-09-13 NOTE — Progress Notes (Signed)
New Patient Note  RE: Cassandra Baxter MRN: [id number] DOB: 09/20/1980 Date of Office Visit: 09/13/2018  Referring provider: No ref. provider found Primary care provider: Vernie Shanks, MD  Chief Complaint: New Patient (Initial Visit); Allergies; and Asthma   History of present illness: Cassandra Baxter is a 38 y.o. female presenting today for evaluation of asthma and rhinitis.  She reports that she has had "bad allergies and asthma" since childhood.  She experiences nasal congestion, rhinorrhea, sneezing, postnasal drainage, nasal pruritus, ocular pruritus, and occasional sinus pressure.  The symptoms occur year-round but are more frequent and severe with exposure to pollen, dust, dander, and molds.  She was treated with immunotherapy when she was in high school and received benefit at that time, however it sounds like she may have discontinued prior to reaching maintenance dose.  She attempts to control her allergy symptoms with nasal saline, fluticasone nasal spray, guaifenesin, and occasional pseudoephedrine.  Her asthma is currently well controlled with Symbicort 160-4.5 g, 2 inhalations via spacer device twice daily, and montelukast 10 mg daily.  While on this regimen, she has only required albuterol rescue once every 2 to 3 months and does not experience limitations in normal daily activities or nocturnal awakenings due to lower respiratory symptoms.  Her asthma is typically triggered by dust and rapid weather changes.  She experiences frequent reflux.  In the past, she had taken omeprazole 20 mg twice daily.  Currently she is not on an antacid.  She believes that she may have had COVID-19 after having been exposed to family members with COVID-19 in mid March and would like to know if she has developed antibodies.  Assessment and plan: Moderate persistent asthma Currently well controlled.  Continue Symbicort 160-4.5 g, 2 inhalations twice daily.  A new spacer device has been  provided.  Due to potential side effects, discontinue montelukast (Singulair).  Continue albuterol HFA, 1 to 2 inhalations every 4-6 hours if needed.  Subjective and objective measures of pulmonary function will be followed and the treatment plan will be adjusted accordingly.  Perennial and seasonal allergic rhinitis  Aeroallergen avoidance measures have been discussed and provided in written form.  A prescription has been provided for levocetirizine, 5 mg daily as needed.  A prescription has been provided for azelastine nasal spray, 1-2 sprays per nostril 2 times daily as needed. Proper nasal spray technique has been discussed and demonstrated.   Nasal saline spray (i.e., Simply Saline) or nasal saline lavage (i.e., NeilMed) is recommended as needed and prior to medicated nasal sprays.  The risks and benefits of aeroallergen immunotherapy have been discussed. The patient is motivated to initiate immunotherapy to reduce symptoms and decrease medication requirement. Informed consent has been signed and allergen vaccine orders have been submitted. Medications will be decreased or discontinued as symptom relief from immunotherapy becomes evident.  Allergic conjunctivitis  Treatment plan as outlined above for allergic rhinitis.  A prescription has been provided for Pazeo, one drop per eye daily as needed.  I have also recommended eye lubricant drops (i.e., Natural Tears) as needed.  GERD (gastroesophageal reflux disease)  Appropriate reflux lifestyle modifications have been provided.  A prescription has been provided for omeprazole 20 mg daily, 30 minutes prior to breakfast.  If needed, add famotidine (Pepcid) 20 mg twice daily.  History of viral syndrome  A laboratory order form has been provided for COVID-19 IgG.  When lab results have returned the patient will be called.    Meds ordered  this encounter  Medications  . omeprazole (PRILOSEC) 20 MG capsule    Sig: Take 1  capsule (20 mg total) by mouth daily.    Dispense:  30 capsule    Refill:  5  . famotidine (PEPCID) 20 MG tablet    Sig: Take 1 tablet (20 mg total) by mouth 2 (two) times daily.    Dispense:  30 tablet    Refill:  5  . olopatadine (PATANOL) 0.1 % ophthalmic solution    Sig: Place 1 drop into both eyes 2 (two) times daily.    Dispense:  5 mL    Refill:  5  . levocetirizine (XYZAL) 5 MG tablet    Sig: Take 1 tablet (5 mg total) by mouth every evening.    Dispense:  30 tablet    Refill:  5  . SYMBICORT 160-4.5 MCG/ACT inhaler    Sig: TAKE 2 PUFFS BY MOUTH TWICE A DAY    Dispense:  1 Inhaler    Refill:  5    Diagnostics: Spirometry: Spirometry reveals an FVC of 4.98 L (108% predicted) and an FEV1 of 3.72 L (99% predicted) with an FEV1 ratio of 90%.  This study was performed while the patient was asymptomatic.  Please see scanned spirometry results for details. Epicutaneous testing: Robust reactivity to grass pollen, ragweed pollen, dust mite antigen, cat hair, dog epithelia, and horse the L epithelia.  Positive to weed pollen and tree pollen. Intradermal testing: Borderline positive to mold mix 4.   Physical examination: Blood pressure (!) 164/96, pulse (!) 113, resp. rate 20, height 5\' 11"  (1.803 m), weight 195 lb 3.2 oz (88.5 kg), SpO2 98 %.  General: Alert, interactive, in no acute distress. HEENT: TMs pearly gray, turbinates edematous without discharge, post-pharynx moderately erythematous. Neck: Supple without lymphadenopathy. Lungs: Clear to auscultation without wheezing, rhonchi or rales. CV: Normal S1, S2 without murmurs. Abdomen: Nondistended, nontender. Skin: Warm and dry, without lesions or rashes. Extremities:  No clubbing, cyanosis or edema. Neuro:   Grossly intact.  Review of systems:  Review of systems negative except as noted in HPI / PMHx or noted below: Review of Systems  Constitutional: Negative.   HENT: Negative.   Eyes: Negative.   Respiratory:  Negative.   Cardiovascular: Negative.   Gastrointestinal: Negative.   Genitourinary: Negative.   Musculoskeletal: Negative.   Skin: Negative.   Neurological: Negative.   Endo/Heme/Allergies: Negative.   Psychiatric/Behavioral: Negative.     Past medical history:  Past Medical History:  Diagnosis Date  . Anxiety   . Asthma     Past surgical history:  Past Surgical History:  Procedure Laterality Date  . KNEE SURGERY      Family history: History reviewed. No pertinent family history.  Social history: Social History   Socioeconomic History  . Marital status: Single    Spouse name: Not on file  . Number of children: Not on file  . Years of education: Not on file  . Highest education level: Not on file  Occupational History  . Not on file  Social Needs  . Financial resource strain: Not on file  . Food insecurity:    Worry: Not on file    Inability: Not on file  . Transportation needs:    Medical: Not on file    Non-medical: Not on file  Tobacco Use  . Smoking status: Never Smoker  . Smokeless tobacco: Never Used  Substance and Sexual Activity  . Alcohol use: No  . Drug use:  Yes    Types: Marijuana  . Sexual activity: Not on file  Lifestyle  . Physical activity:    Days per week: Not on file    Minutes per session: Not on file  . Stress: Not on file  Relationships  . Social connections:    Talks on phone: Not on file    Gets together: Not on file    Attends religious service: Not on file    Active member of club or organization: Not on file    Attends meetings of clubs or organizations: Not on file    Relationship status: Not on file  . Intimate partner violence:    Fear of current or ex partner: Not on file    Emotionally abused: Not on file    Physically abused: Not on file    Forced sexual activity: Not on file  Other Topics Concern  . Not on file  Social History Narrative  . Not on file   Environmental History: The patient lives in a  39 year old house with hardwood floors throughout, gas heat, and central air.  There is mold/water damage in the home.  She has no pets.  Allergies as of 09/13/2018   No Known Allergies     Medication List       Accurate as of Sep 13, 2018 10:37 PM. If you have any questions, ask your nurse or doctor.        STOP taking these medications   montelukast 10 MG tablet Commonly known as:  SINGULAIR Stopped by:  Edmonia Lynch, MD     TAKE these medications   acetaminophen 500 MG tablet Commonly known as:  TYLENOL Take 1,000 mg by mouth 3 (three) times daily as needed (pain).   albuterol 108 (90 Base) MCG/ACT inhaler Commonly known as:  VENTOLIN HFA Inhale 2 puffs into the lungs daily as needed (asthma symptoms).   ALPRAZolam 0.5 MG tablet Commonly known as:  XANAX Take 0.5 mg by mouth at bedtime.   CALCIUM PO Take 1 tablet by mouth daily.   celecoxib 100 MG capsule Commonly known as:  CELEBREX Take 100 mg by mouth daily.   clonazePAM 1 MG tablet Commonly known as:  KLONOPIN TAKE 1 TABLET BY MOUTH 4 TIMES A DAY AS NEEDED   EpiPen 2-Pak 0.3 mg/0.3 mL Soaj injection Generic drug:  EPINEPHrine   famotidine 20 MG tablet Commonly known as:  Pepcid Take 1 tablet (20 mg total) by mouth 2 (two) times daily. Started by:  Edmonia Lynch, MD   fluticasone 50 MCG/ACT nasal spray Commonly known as:  FLONASE Place into the nose.   FOLIC ACID PO Take 1 tablet by mouth daily.   gabapentin 300 MG capsule Commonly known as:  NEURONTIN TAKE 2 CAPSULES BY MOUTH 2 3 TIMES A DAY   GLUCOSAMINE PO Take 1 tablet by mouth daily.   Junel FE 1/20 1-20 MG-MCG tablet Generic drug:  norethindrone-ethinyl estradiol What changed:  Another medication with the same name was removed. Continue taking this medication, and follow the directions you see here. Changed by:  Edmonia Lynch, MD   lamoTRIgine 150 MG tablet Commonly known as:  LAMICTAL Take 150 mg by mouth daily.    levocetirizine 5 MG tablet Commonly known as:  XYZAL Take 1 tablet (5 mg total) by mouth every evening. Started by:  Edmonia Lynch, MD   MAGNESIUM PO Take 1 tablet by mouth daily. otc   meloxicam 15 MG tablet Commonly known  as:  MOBIC Take 15 mg by mouth daily.   olopatadine 0.1 % ophthalmic solution Commonly known as:  PATANOL Place 1 drop into both eyes 2 (two) times daily. Started by:  Edmonia Lynch, MD   omeprazole 20 MG capsule Commonly known as:  PriLOSEC Take 1 capsule (20 mg total) by mouth daily. What changed:    medication strength  how much to take  when to take this Changed by:  R Edgar Frisk, MD   orphenadrine 100 MG tablet Commonly known as:  NORFLEX TAKE 1 TABLET BY MOUTH TWICE A DAY IN THE MORNING AND EVENING AS NEEDED   POTASSIUM PO Take 1 tablet by mouth daily. otc   prazosin 1 MG capsule Commonly known as:  MINIPRESS TAKE 4 CAPSULES BY MOUTH AT BEDTIME   PRESCRIPTION MEDICATION Apply 1 application topically 3 (three) times daily as needed (pain). Cream compounded at [place] [phone]):  Ketoprofen 15%, baclofen 2%, cyclobenzaprine 2%, gabapentin 10%, lidocaine 2%   sertraline 50 MG tablet Commonly known as:  ZOLOFT Take 150 mg by mouth at bedtime.   Symbicort 160-4.5 MCG/ACT inhaler Generic drug:  budesonide-formoterol TAKE 2 PUFFS BY MOUTH TWICE A DAY   traMADol 50 MG tablet Commonly known as:  ULTRAM Take 50 mg by mouth daily as needed (pain).   traZODone 50 MG tablet Commonly known as:  DESYREL TAKE 1 TO 3 TABLETS BY MOUTH AT BEDTIME   TURMERIC CURCUMIN PO Take 1 capsule by mouth daily.   VITAMIN B-12 PO Take 1 tablet by mouth daily.   Vyvanse 40 MG capsule Generic drug:  lisdexamfetamine 60 mg       Known medication allergies: No Known Allergies  I appreciate the opportunity to take part in Avaline's care. Please do not hesitate to contact me with questions.  Sincerely,   R. Edgar Frisk, MD

## 2018-09-13 NOTE — Assessment & Plan Note (Signed)
   Appropriate reflux lifestyle modifications have been provided.  A prescription has been provided for omeprazole 20 mg daily, 30 minutes prior to breakfast.  If needed, add famotidine (Pepcid) 20 mg twice daily.

## 2018-09-13 NOTE — Assessment & Plan Note (Signed)
   Treatment plan as outlined above for allergic rhinitis.  A prescription has been provided for Pazeo, one drop per eye daily as needed.  I have also recommended eye lubricant drops (i.e., Natural Tears) as needed. 

## 2018-09-14 DIAGNOSIS — J301 Allergic rhinitis due to pollen: Secondary | ICD-10-CM | POA: Diagnosis not present

## 2018-09-14 LAB — SARS-COV-2 ANTIBODY, IGM: SARS-CoV-2 Antibody, IgM: NEGATIVE

## 2018-09-14 LAB — SAR COV2 SEROLOGY (COVID19)AB(IGG),IA: SARS-CoV-2 Ab, IgG: NEGATIVE

## 2018-09-14 NOTE — Progress Notes (Signed)
VIALS EXP 09-14-2019

## 2018-09-15 DIAGNOSIS — J3089 Other allergic rhinitis: Secondary | ICD-10-CM | POA: Diagnosis not present

## 2018-09-15 DIAGNOSIS — F410 Panic disorder [episodic paroxysmal anxiety] without agoraphobia: Secondary | ICD-10-CM | POA: Diagnosis not present

## 2018-09-21 DIAGNOSIS — F410 Panic disorder [episodic paroxysmal anxiety] without agoraphobia: Secondary | ICD-10-CM | POA: Diagnosis not present

## 2018-09-27 ENCOUNTER — Ambulatory Visit (INDEPENDENT_AMBULATORY_CARE_PROVIDER_SITE_OTHER): Payer: BLUE CROSS/BLUE SHIELD

## 2018-09-27 ENCOUNTER — Other Ambulatory Visit: Payer: Self-pay

## 2018-09-27 DIAGNOSIS — F410 Panic disorder [episodic paroxysmal anxiety] without agoraphobia: Secondary | ICD-10-CM | POA: Diagnosis not present

## 2018-09-27 DIAGNOSIS — J309 Allergic rhinitis, unspecified: Secondary | ICD-10-CM | POA: Diagnosis not present

## 2018-09-27 NOTE — Progress Notes (Signed)
Immunotherapy   Patient Details  Name: Cassandra Baxter MRN: [id number] Date of Birth: 1980/08/27  09/27/2018  [name] started allergy injections today. Patient received 0.05 of both her blue. One with Grass-Weeds-Tree and the other with Mold-DM-Cat-Dog. Patient waited in an exam room for 30 minutes with no problems. Following schedule: A Frequency: 1-2 times weekly with 72 hours in between. Epi-Pen: Yes Consent signed and patient instructions given.   Herbie Drape 09/27/2018, 1:38 PM

## 2018-10-01 DIAGNOSIS — F410 Panic disorder [episodic paroxysmal anxiety] without agoraphobia: Secondary | ICD-10-CM | POA: Diagnosis not present

## 2018-10-04 DIAGNOSIS — F410 Panic disorder [episodic paroxysmal anxiety] without agoraphobia: Secondary | ICD-10-CM | POA: Diagnosis not present

## 2018-10-11 DIAGNOSIS — F410 Panic disorder [episodic paroxysmal anxiety] without agoraphobia: Secondary | ICD-10-CM | POA: Diagnosis not present

## 2018-10-15 DIAGNOSIS — F410 Panic disorder [episodic paroxysmal anxiety] without agoraphobia: Secondary | ICD-10-CM | POA: Diagnosis not present

## 2018-10-18 DIAGNOSIS — F410 Panic disorder [episodic paroxysmal anxiety] without agoraphobia: Secondary | ICD-10-CM | POA: Diagnosis not present

## 2018-10-22 DIAGNOSIS — F410 Panic disorder [episodic paroxysmal anxiety] without agoraphobia: Secondary | ICD-10-CM | POA: Diagnosis not present

## 2018-10-25 DIAGNOSIS — F410 Panic disorder [episodic paroxysmal anxiety] without agoraphobia: Secondary | ICD-10-CM | POA: Diagnosis not present

## 2018-11-03 DIAGNOSIS — F900 Attention-deficit hyperactivity disorder, predominantly inattentive type: Secondary | ICD-10-CM | POA: Diagnosis not present

## 2018-11-03 DIAGNOSIS — F341 Dysthymic disorder: Secondary | ICD-10-CM | POA: Diagnosis not present

## 2018-11-03 DIAGNOSIS — F331 Major depressive disorder, recurrent, moderate: Secondary | ICD-10-CM | POA: Diagnosis not present

## 2018-11-03 DIAGNOSIS — F4310 Post-traumatic stress disorder, unspecified: Secondary | ICD-10-CM | POA: Diagnosis not present

## 2018-11-05 ENCOUNTER — Other Ambulatory Visit: Payer: Self-pay | Admitting: Allergy and Immunology

## 2018-11-25 DIAGNOSIS — M545 Low back pain: Secondary | ICD-10-CM | POA: Diagnosis not present

## 2018-11-25 DIAGNOSIS — Z79899 Other long term (current) drug therapy: Secondary | ICD-10-CM | POA: Diagnosis not present

## 2018-11-25 DIAGNOSIS — M791 Myalgia, unspecified site: Secondary | ICD-10-CM | POA: Diagnosis not present

## 2018-11-25 DIAGNOSIS — M5136 Other intervertebral disc degeneration, lumbar region: Secondary | ICD-10-CM | POA: Diagnosis not present

## 2018-12-15 DIAGNOSIS — F900 Attention-deficit hyperactivity disorder, predominantly inattentive type: Secondary | ICD-10-CM | POA: Diagnosis not present

## 2018-12-15 DIAGNOSIS — F4310 Post-traumatic stress disorder, unspecified: Secondary | ICD-10-CM | POA: Diagnosis not present

## 2018-12-20 ENCOUNTER — Ambulatory Visit (INDEPENDENT_AMBULATORY_CARE_PROVIDER_SITE_OTHER): Payer: BC Managed Care – PPO | Admitting: Allergy and Immunology

## 2018-12-20 ENCOUNTER — Other Ambulatory Visit: Payer: Self-pay

## 2018-12-20 ENCOUNTER — Encounter: Payer: Self-pay | Admitting: Allergy and Immunology

## 2018-12-20 VITALS — BP 110/86 | HR 64 | Temp 98.0°F | Resp 16 | Ht 71.0 in

## 2018-12-20 DIAGNOSIS — K219 Gastro-esophageal reflux disease without esophagitis: Secondary | ICD-10-CM | POA: Diagnosis not present

## 2018-12-20 DIAGNOSIS — J4540 Moderate persistent asthma, uncomplicated: Secondary | ICD-10-CM | POA: Diagnosis not present

## 2018-12-20 DIAGNOSIS — J4520 Mild intermittent asthma, uncomplicated: Secondary | ICD-10-CM | POA: Diagnosis not present

## 2018-12-20 DIAGNOSIS — H1013 Acute atopic conjunctivitis, bilateral: Secondary | ICD-10-CM

## 2018-12-20 DIAGNOSIS — J3089 Other allergic rhinitis: Secondary | ICD-10-CM | POA: Diagnosis not present

## 2018-12-20 MED ORDER — FAMOTIDINE 20 MG PO TABS: 20.0000 mg | ORAL_TABLET | Freq: Two times a day (BID) | ORAL | 2 refills | Status: DC | PRN

## 2018-12-20 MED ORDER — BUDESONIDE-FORMOTEROL FUMARATE 160-4.5 MCG/ACT IN AERO: 2.0000 | INHALATION_SPRAY | Freq: Two times a day (BID) | RESPIRATORY_TRACT | 5 refills | Status: DC

## 2018-12-20 MED ORDER — XHANCE 93 MCG/ACT NA EXHU: 2.0000 | INHALANT_SUSPENSION | Freq: Two times a day (BID) | NASAL | 5 refills | Status: DC | PRN

## 2018-12-20 NOTE — Assessment & Plan Note (Signed)
   Treatment plan as outlined above for allergic rhinitis.  Continue Pazeo, one drop per eye daily as needed.  I have also recommended eye lubricant drops (i.e., Natural Tears) as needed.

## 2018-12-20 NOTE — Assessment & Plan Note (Signed)
   Continue appropriate aeroallergen avoidance measures and levocetirizine 5 mg daily as needed.  A prescription has been provided for [facility], 2 sprays per nostril twice daily if needed.  If needed, may add azelastine nasal spray.  Nasal saline lavage (NeilMed) has been recommended as needed and prior to medicated nasal sprays along with instructions for proper administration.  Restart immunotherapy injections.

## 2018-12-20 NOTE — Progress Notes (Signed)
Follow-up Note  RE: Cassandra Baxter MRN: [id number] DOB: [birth year] Date of Office Visit: 12/20/2018  Primary care provider: Vernie Shanks, MD Referring provider: Vernie Shanks, MD  History of present illness: [place] "Cassandra Baxter" Baxter is a 38 y.o. female with persistent asthma, rhinoconjunctivitis, and GERD presenting today for follow-up.  She was previously seen in this clinic for her initial evaluation on Sep 13, 2018.  She reports that she recently has had remodeling done in her old home and as a result there has been quite a bit of dust stirred up in the home.  She believes that this is triggered some increased coughing recently.  In addition, the dust as well as pollen have caused nasal congestion and rhinorrhea.  She is interested in restarting aeroallergen immunotherapy to reduce symptoms and decrease medication requirement.  She reports that while on omeprazole her reflux has improved, however she is still experiencing reflux, particularly at nighttime.  However, she admits that because of the busy schedule she has been eating late at night and also has been consuming diet cola drink in the evening.  Assessment and plan: Moderate persistent asthma  Continue Symbicort 160-4.5 g, 2 inhalations via spacer device twice daily, and albuterol HFA, 1 to 2 inhalations every 4-6 hours if needed.  Subjective and objective measures of pulmonary function will be followed and the treatment plan will be adjusted accordingly.  Perennial and seasonal allergic rhinitis  Continue appropriate aeroallergen avoidance measures and levocetirizine 5 mg daily as needed.  A prescription has been provided for [facility], 2 sprays per nostril twice daily if needed.  If needed, may add azelastine nasal spray.  Nasal saline lavage (NeilMed) has been recommended as needed and prior to medicated nasal sprays along with instructions for proper administration.  Restart immunotherapy injections.  Allergic  conjunctivitis  Treatment plan as outlined above for allergic rhinitis.  Continue Pazeo, one drop per eye daily as needed.  I have also recommended eye lubricant drops (i.e., Natural Tears) as needed.  GERD (gastroesophageal reflux disease)  Continue appropriate reflux lifestyle modifications and omeprazole as prescribed.  Add famotidine (Pepcid) 20 mg 1-2 times daily if needed.   Meds ordered this encounter  Medications  . famotidine (PEPCID) 20 MG tablet    Sig: Take 1 tablet (20 mg total) by mouth 2 (two) times daily as needed for heartburn or indigestion.    Dispense:  60 tablet    Refill:  2    Place on hold until patient needs.  . Fluticasone Propionate (XHANCE) 93 MCG/ACT EXHU    Sig: Place 2 sprays into the nose 2 (two) times daily as needed.    Dispense:  32 mL    Refill:  5    [phone] (M)  . budesonide-formoterol (SYMBICORT) 160-4.5 MCG/ACT inhaler    Sig: Inhale 2 puffs into the lungs 2 (two) times daily.    Dispense:  1 Inhaler    Refill:  5    Diagnostics: Spirometry:  Normal with an FEV1 of 98% predicted. This study was performed while the patient was asymptomatic.  Please see scanned spirometry results for details.    Physical examination: Blood pressure 110/86, pulse 64, temperature 98 F (36.7 C), temperature source Temporal, resp. rate 16, height 5\' 11"  (1.803 m), SpO2 98 %.  General: Alert, interactive, in no acute distress. HEENT: TMs pearly gray, turbinates mildly edematous with clear discharge, post-pharynx moderately erythematous. Neck: Supple without lymphadenopathy. Lungs: Clear to auscultation without wheezing, rhonchi or rales. CV:  Normal S1, S2 without murmurs. Skin: Warm and dry, without lesions or rashes.  The following portions of the patient's history were reviewed and updated as appropriate: allergies, current medications, past family history, past medical history, past social history, past surgical history and problem list.   Allergies as of 12/20/2018   No Known Allergies     Medication List       Accurate as of December 20, 2018  2:47 PM. If you have any questions, ask your nurse or doctor.        acetaminophen 500 MG tablet Commonly known as: TYLENOL Take 1,000 mg by mouth 3 (three) times daily as needed (pain).   Adderall XR 30 MG 24 hr capsule Generic drug: amphetamine-dextroamphetamine Take by mouth daily.   albuterol 108 (90 Base) MCG/ACT inhaler Commonly known as: VENTOLIN HFA Inhale 2 puffs into the lungs daily as needed (asthma symptoms).   ALPRAZolam 0.5 MG tablet Commonly known as: XANAX Take 0.5 mg by mouth at bedtime.   CALCIUM PO Take 1 tablet by mouth daily.   celecoxib 100 MG capsule Commonly known as: CELEBREX Take 100 mg by mouth daily.   clonazePAM 1 MG tablet Commonly known as: KLONOPIN TAKE 1 TABLET BY MOUTH 4 TIMES A DAY AS NEEDED   EpiPen 2-Pak 0.3 mg/0.3 mL Soaj injection Generic drug: EPINEPHrine   famotidine 20 MG tablet Commonly known as: PEPCID Take 1 tablet (20 mg total) by mouth 2 (two) times daily as needed for heartburn or indigestion. What changed:   when to take this  reasons to take this Changed by: R Edgar Frisk, MD   Cyril Loosen MCG/ACT Exhu Generic drug: Fluticasone Propionate Place 2 sprays into the nose 2 (two) times daily as needed. What changed: You were already taking a medication with the same name, and this prescription was added. Make sure you understand how and when to take each. Changed by: Edmonia Lynch, MD   fluticasone 50 MCG/ACT nasal spray Commonly known as: FLONASE Place into the nose. What changed: Another medication with the same name was added. Make sure you understand how and when to take each. Changed by: Edmonia Lynch, MD   FOLIC ACID PO Take 1 tablet by mouth daily.   gabapentin 300 MG capsule Commonly known as: NEURONTIN TAKE 2 CAPSULES BY MOUTH 2 3 TIMES A DAY   GLUCOSAMINE PO Take 1 tablet by mouth  daily.   Junel FE 1/20 1-20 MG-MCG tablet Generic drug: norethindrone-ethinyl estradiol   lamoTRIgine 150 MG tablet Commonly known as: LAMICTAL Take 50 mg by mouth daily.   levocetirizine 5 MG tablet Commonly known as: XYZAL Take 1 tablet (5 mg total) by mouth every evening.   MAGNESIUM PO Take 1 tablet by mouth daily. otc   meloxicam 15 MG tablet Commonly known as: MOBIC Take 15 mg by mouth daily.   olopatadine 0.1 % ophthalmic solution Commonly known as: PATANOL Place 1 drop into both eyes 2 (two) times daily.   omeprazole 20 MG capsule Commonly known as: PriLOSEC Take 1 capsule (20 mg total) by mouth daily.   orphenadrine 100 MG tablet Commonly known as: NORFLEX TAKE 1 TABLET BY MOUTH TWICE A DAY IN THE MORNING AND EVENING AS NEEDED   POTASSIUM PO Take 1 tablet by mouth daily. otc   prazosin 1 MG capsule Commonly known as: MINIPRESS TAKE 4 CAPSULES BY MOUTH AT BEDTIME   PRESCRIPTION MEDICATION Apply 1 application topically 3 (three) times daily as needed (pain). Cream compounded  at [place] [phone]):  Ketoprofen 15%, baclofen 2%, cyclobenzaprine 2%, gabapentin 10%, lidocaine 2%   sertraline 100 MG tablet Commonly known as: ZOLOFT TAKE 2 TABLET BY MOUTH EVERY DAY   sertraline 50 MG tablet Commonly known as: ZOLOFT Take 150 mg by mouth at bedtime.   Symbicort 160-4.5 MCG/ACT inhaler Generic drug: budesonide-formoterol TAKE 2 PUFFS BY MOUTH TWICE A DAY What changed: Another medication with the same name was added. Make sure you understand how and when to take each. Changed by: Edmonia Lynch, MD   budesonide-formoterol 160-4.5 MCG/ACT inhaler Commonly known as: Symbicort Inhale 2 puffs into the lungs 2 (two) times daily. What changed: You were already taking a medication with the same name, and this prescription was added. Make sure you understand how and when to take each. Changed by: Edmonia Lynch, MD   traMADol 50 MG tablet  Commonly known as: ULTRAM Take 50 mg by mouth daily as needed (pain).   traZODone 50 MG tablet Commonly known as: DESYREL TAKE 1 TO 3 TABLETS BY MOUTH AT BEDTIME   TURMERIC CURCUMIN PO Take 1 capsule by mouth daily.   VITAMIN B-12 PO Take 1 tablet by mouth daily.   Vyvanse 40 MG capsule Generic drug: lisdexamfetamine 60 mg       No Known Allergies  Review of systems: Review of systems negative except as noted in HPI / PMHx or noted below: Constitutional: Negative.  HENT: Negative.   Eyes: Negative.  Respiratory: Negative.   Cardiovascular: Negative.  Gastrointestinal: Negative.  Genitourinary: Negative.  Musculoskeletal: Negative.  Neurological: Negative.  Endo/Heme/Allergies: Negative.  Cutaneous: Negative.  Past Medical History:  Diagnosis Date  . Anxiety   . Asthma     No family history on file.  Social History   Socioeconomic History  . Marital status: Single    Spouse name: Not on file  . Number of children: Not on file  . Years of education: Not on file  . Highest education level: Not on file  Occupational History  . Not on file  Social Needs  . Financial resource strain: Not on file  . Food insecurity    Worry: Not on file    Inability: Not on file  . Transportation needs    Medical: Not on file    Non-medical: Not on file  Tobacco Use  . Smoking status: Never Smoker  . Smokeless tobacco: Never Used  Substance and Sexual Activity  . Alcohol use: No  . Drug use: Yes    Types: Marijuana  . Sexual activity: Not on file  Lifestyle  . Physical activity    Days per week: Not on file    Minutes per session: Not on file  . Stress: Not on file  Relationships  . Social Herbalist on phone: Not on file    Gets together: Not on file    Attends religious service: Not on file    Active member of club or organization: Not on file    Attends meetings of clubs or organizations: Not on file    Relationship status: Not on file  .  Intimate partner violence    Fear of current or ex partner: Not on file    Emotionally abused: Not on file    Physically abused: Not on file    Forced sexual activity: Not on file  Other Topics Concern  . Not on file  Social History Narrative  . Not on file  I appreciate the opportunity to take part in [place]aney's care. Please do not hesitate to contact me with questions.  Sincerely,   R. Edgar Frisk, MD

## 2018-12-20 NOTE — Patient Instructions (Addendum)
Moderate persistent asthma  Continue Symbicort 160-4.5 g, 2 inhalations via spacer device twice daily, and albuterol HFA, 1 to 2 inhalations every 4-6 hours if needed.  Subjective and objective measures of pulmonary function will be followed and the treatment plan will be adjusted accordingly.  Perennial and seasonal allergic rhinitis  Continue appropriate aeroallergen avoidance measures and levocetirizine 5 mg daily as needed.  A prescription has been provided for [facility], 2 sprays per nostril twice daily if needed.  If needed, may add azelastine nasal spray.  Nasal saline lavage (NeilMed) has been recommended as needed and prior to medicated nasal sprays along with instructions for proper administration.  Restart immunotherapy injections.  Allergic conjunctivitis  Treatment plan as outlined above for allergic rhinitis.  Continue Pazeo, one drop per eye daily as needed.  I have also recommended eye lubricant drops (i.e., Natural Tears) as needed.  GERD (gastroesophageal reflux disease)  Continue appropriate reflux lifestyle modifications and omeprazole as prescribed.  Add famotidine (Pepcid) 20 mg 1-2 times daily if needed.   Return in about 5 months (around 05/22/2019), or if symptoms worsen or fail to improve.

## 2018-12-20 NOTE — Assessment & Plan Note (Signed)
   Continue appropriate reflux lifestyle modifications and omeprazole as prescribed.  Add famotidine (Pepcid) 20 mg 1-2 times daily if needed.

## 2018-12-20 NOTE — Assessment & Plan Note (Signed)
   Continue Symbicort 160-4.5 g, 2 inhalations via spacer device twice daily, and albuterol HFA, 1 to 2 inhalations every 4-6 hours if needed.  Subjective and objective measures of pulmonary function will be followed and the treatment plan will be adjusted accordingly.

## 2018-12-21 ENCOUNTER — Telehealth: Payer: Self-pay

## 2018-12-21 NOTE — Telephone Encounter (Signed)
When the patient checked out I asked if she would like to schedule with one of our female providers. Patient states she loves Dr Cassandra Baxter and would only like to have a visit with Dr Cassandra Baxter. Patient thanked me for asking. I have scheduled the patient to see Dr Cassandra Baxter for a 5 month follow up.  Thanks

## 2018-12-21 NOTE — Telephone Encounter (Signed)
-----   Message from Adelina Mings, MD sent at 12/20/2018  1:27 PM EDT ----- Given the patient's history and discomfort with female healthcare providers, a female nurse is required to be in the room along with me as I take the history and do the physical examination.  As this removes a female nurse from the floor during that time, it would be best for this patient to be seen by a female physician, Dr. Maudie Mercury or Dr. Nelva Bush, for future visits. I made this request after the patient's initial visit as well. Thanks.

## 2018-12-22 DIAGNOSIS — R3915 Urgency of urination: Secondary | ICD-10-CM | POA: Diagnosis not present

## 2018-12-22 DIAGNOSIS — Z01411 Encounter for gynecological examination (general) (routine) with abnormal findings: Secondary | ICD-10-CM | POA: Diagnosis not present

## 2018-12-22 DIAGNOSIS — R3914 Feeling of incomplete bladder emptying: Secondary | ICD-10-CM | POA: Diagnosis not present

## 2018-12-28 DIAGNOSIS — F332 Major depressive disorder, recurrent severe without psychotic features: Secondary | ICD-10-CM | POA: Diagnosis not present

## 2018-12-30 DIAGNOSIS — F332 Major depressive disorder, recurrent severe without psychotic features: Secondary | ICD-10-CM | POA: Diagnosis not present

## 2018-12-30 NOTE — Telephone Encounter (Signed)
Confirm that patient was asked by Norval Morton if she wanted to see one of our female providers for her next appointment and patient stated she wanted to continue with Dr. Verlin Fester.

## 2019-01-05 ENCOUNTER — Other Ambulatory Visit: Payer: Self-pay | Admitting: Allergy and Immunology

## 2019-01-05 DIAGNOSIS — F332 Major depressive disorder, recurrent severe without psychotic features: Secondary | ICD-10-CM | POA: Diagnosis not present

## 2019-01-07 DIAGNOSIS — F332 Major depressive disorder, recurrent severe without psychotic features: Secondary | ICD-10-CM | POA: Diagnosis not present

## 2019-01-12 DIAGNOSIS — F332 Major depressive disorder, recurrent severe without psychotic features: Secondary | ICD-10-CM | POA: Diagnosis not present

## 2019-01-14 DIAGNOSIS — F332 Major depressive disorder, recurrent severe without psychotic features: Secondary | ICD-10-CM | POA: Diagnosis not present

## 2019-01-18 DIAGNOSIS — F332 Major depressive disorder, recurrent severe without psychotic features: Secondary | ICD-10-CM | POA: Diagnosis not present

## 2019-01-20 DIAGNOSIS — F332 Major depressive disorder, recurrent severe without psychotic features: Secondary | ICD-10-CM | POA: Diagnosis not present

## 2019-01-21 DIAGNOSIS — F341 Dysthymic disorder: Secondary | ICD-10-CM | POA: Diagnosis not present

## 2019-01-21 DIAGNOSIS — F4310 Post-traumatic stress disorder, unspecified: Secondary | ICD-10-CM | POA: Diagnosis not present

## 2019-01-21 DIAGNOSIS — F331 Major depressive disorder, recurrent, moderate: Secondary | ICD-10-CM | POA: Diagnosis not present

## 2019-01-25 DIAGNOSIS — F332 Major depressive disorder, recurrent severe without psychotic features: Secondary | ICD-10-CM | POA: Diagnosis not present

## 2019-02-02 DIAGNOSIS — F332 Major depressive disorder, recurrent severe without psychotic features: Secondary | ICD-10-CM | POA: Diagnosis not present

## 2019-02-04 DIAGNOSIS — F332 Major depressive disorder, recurrent severe without psychotic features: Secondary | ICD-10-CM | POA: Diagnosis not present

## 2019-02-10 DIAGNOSIS — F332 Major depressive disorder, recurrent severe without psychotic features: Secondary | ICD-10-CM | POA: Diagnosis not present

## 2019-02-15 DIAGNOSIS — F332 Major depressive disorder, recurrent severe without psychotic features: Secondary | ICD-10-CM | POA: Diagnosis not present

## 2019-02-19 DIAGNOSIS — F341 Dysthymic disorder: Secondary | ICD-10-CM | POA: Diagnosis not present

## 2019-02-19 DIAGNOSIS — F4310 Post-traumatic stress disorder, unspecified: Secondary | ICD-10-CM | POA: Diagnosis not present

## 2019-02-19 DIAGNOSIS — F900 Attention-deficit hyperactivity disorder, predominantly inattentive type: Secondary | ICD-10-CM | POA: Diagnosis not present

## 2019-02-22 DIAGNOSIS — F332 Major depressive disorder, recurrent severe without psychotic features: Secondary | ICD-10-CM | POA: Diagnosis not present

## 2019-02-25 DIAGNOSIS — M5136 Other intervertebral disc degeneration, lumbar region: Secondary | ICD-10-CM | POA: Diagnosis not present

## 2019-02-25 DIAGNOSIS — M545 Low back pain: Secondary | ICD-10-CM | POA: Diagnosis not present

## 2019-02-25 DIAGNOSIS — Z79891 Long term (current) use of opiate analgesic: Secondary | ICD-10-CM | POA: Diagnosis not present

## 2019-02-25 DIAGNOSIS — M791 Myalgia, unspecified site: Secondary | ICD-10-CM | POA: Diagnosis not present

## 2019-03-01 ENCOUNTER — Other Ambulatory Visit: Payer: Self-pay | Admitting: Allergy and Immunology

## 2019-03-01 DIAGNOSIS — F332 Major depressive disorder, recurrent severe without psychotic features: Secondary | ICD-10-CM | POA: Diagnosis not present

## 2019-03-03 DIAGNOSIS — F332 Major depressive disorder, recurrent severe without psychotic features: Secondary | ICD-10-CM | POA: Diagnosis not present

## 2019-03-08 DIAGNOSIS — F332 Major depressive disorder, recurrent severe without psychotic features: Secondary | ICD-10-CM | POA: Diagnosis not present

## 2019-03-17 DIAGNOSIS — F332 Major depressive disorder, recurrent severe without psychotic features: Secondary | ICD-10-CM | POA: Diagnosis not present

## 2019-03-29 DIAGNOSIS — F332 Major depressive disorder, recurrent severe without psychotic features: Secondary | ICD-10-CM | POA: Diagnosis not present

## 2019-04-05 DIAGNOSIS — F332 Major depressive disorder, recurrent severe without psychotic features: Secondary | ICD-10-CM | POA: Diagnosis not present

## 2019-04-15 DIAGNOSIS — F4310 Post-traumatic stress disorder, unspecified: Secondary | ICD-10-CM | POA: Diagnosis not present

## 2019-04-15 DIAGNOSIS — F410 Panic disorder [episodic paroxysmal anxiety] without agoraphobia: Secondary | ICD-10-CM | POA: Diagnosis not present

## 2019-04-15 DIAGNOSIS — F900 Attention-deficit hyperactivity disorder, predominantly inattentive type: Secondary | ICD-10-CM | POA: Diagnosis not present

## 2019-04-19 DIAGNOSIS — F332 Major depressive disorder, recurrent severe without psychotic features: Secondary | ICD-10-CM | POA: Diagnosis not present

## 2019-04-26 DIAGNOSIS — F332 Major depressive disorder, recurrent severe without psychotic features: Secondary | ICD-10-CM | POA: Diagnosis not present

## 2019-05-05 DIAGNOSIS — F332 Major depressive disorder, recurrent severe without psychotic features: Secondary | ICD-10-CM | POA: Diagnosis not present

## 2019-05-12 ENCOUNTER — Other Ambulatory Visit: Payer: Self-pay | Admitting: Allergy and Immunology

## 2019-05-12 DIAGNOSIS — F332 Major depressive disorder, recurrent severe without psychotic features: Secondary | ICD-10-CM | POA: Diagnosis not present

## 2019-05-19 DIAGNOSIS — F332 Major depressive disorder, recurrent severe without psychotic features: Secondary | ICD-10-CM | POA: Diagnosis not present

## 2019-05-23 ENCOUNTER — Ambulatory Visit: Payer: BC Managed Care – PPO | Admitting: Allergy and Immunology

## 2019-05-24 DIAGNOSIS — F332 Major depressive disorder, recurrent severe without psychotic features: Secondary | ICD-10-CM | POA: Diagnosis not present

## 2019-05-26 DIAGNOSIS — F332 Major depressive disorder, recurrent severe without psychotic features: Secondary | ICD-10-CM | POA: Diagnosis not present

## 2019-05-30 ENCOUNTER — Other Ambulatory Visit: Payer: Self-pay | Admitting: Allergy and Immunology

## 2019-05-31 DIAGNOSIS — F900 Attention-deficit hyperactivity disorder, predominantly inattentive type: Secondary | ICD-10-CM | POA: Diagnosis not present

## 2019-05-31 DIAGNOSIS — M791 Myalgia, unspecified site: Secondary | ICD-10-CM | POA: Diagnosis not present

## 2019-05-31 DIAGNOSIS — M5136 Other intervertebral disc degeneration, lumbar region: Secondary | ICD-10-CM | POA: Diagnosis not present

## 2019-05-31 DIAGNOSIS — F331 Major depressive disorder, recurrent, moderate: Secondary | ICD-10-CM | POA: Diagnosis not present

## 2019-05-31 DIAGNOSIS — F332 Major depressive disorder, recurrent severe without psychotic features: Secondary | ICD-10-CM | POA: Diagnosis not present

## 2019-05-31 DIAGNOSIS — M545 Low back pain: Secondary | ICD-10-CM | POA: Diagnosis not present

## 2019-05-31 DIAGNOSIS — G894 Chronic pain syndrome: Secondary | ICD-10-CM | POA: Diagnosis not present

## 2019-06-07 DIAGNOSIS — F332 Major depressive disorder, recurrent severe without psychotic features: Secondary | ICD-10-CM | POA: Diagnosis not present

## 2019-06-13 ENCOUNTER — Other Ambulatory Visit: Payer: Self-pay | Admitting: Allergy and Immunology

## 2019-06-14 DIAGNOSIS — F332 Major depressive disorder, recurrent severe without psychotic features: Secondary | ICD-10-CM | POA: Diagnosis not present

## 2019-06-21 ENCOUNTER — Other Ambulatory Visit: Payer: Self-pay | Admitting: Allergy and Immunology

## 2019-06-21 DIAGNOSIS — F332 Major depressive disorder, recurrent severe without psychotic features: Secondary | ICD-10-CM | POA: Diagnosis not present

## 2019-06-23 ENCOUNTER — Ambulatory Visit: Payer: Self-pay | Attending: Internal Medicine

## 2019-06-23 ENCOUNTER — Other Ambulatory Visit: Payer: Self-pay

## 2019-06-23 ENCOUNTER — Telehealth: Payer: Self-pay | Admitting: *Deleted

## 2019-06-23 DIAGNOSIS — Z23 Encounter for immunization: Secondary | ICD-10-CM | POA: Insufficient documentation

## 2019-06-23 NOTE — Progress Notes (Signed)
   Covid-19 Vaccination Clinic  Name:  Genora Claycamp    MRN: [id number] DOB: 06-19-1980  06/23/2019  Ms. Mbaye was observed post Covid-19 immunization for 15 minutes without incidence. She was provided with Vaccine Information Sheet and instruction to access the V-Safe system.   Ms. Cram was instructed to call 911 with any severe reactions post vaccine: Marland Kitchen Difficulty breathing  . Swelling of your face and throat  . A fast heartbeat  . A bad rash all over your body  . Dizziness and weakness    Immunizations Administered    Name Date Dose VIS Date Route   Moderna COVID-19 Vaccine 06/23/2019 12:58 PM 0.5 mL 03/29/2019 Intramuscular   Manufacturer: Moderna   Lot: [id number]   [id number]

## 2019-06-23 NOTE — Telephone Encounter (Signed)
Called to confirm appointment time and location. Done

## 2019-07-01 ENCOUNTER — Other Ambulatory Visit: Payer: Self-pay | Admitting: Allergy and Immunology

## 2019-07-05 ENCOUNTER — Other Ambulatory Visit: Payer: Self-pay | Admitting: Allergy and Immunology

## 2019-07-06 ENCOUNTER — Other Ambulatory Visit: Payer: Self-pay | Admitting: Allergy and Immunology

## 2019-07-26 ENCOUNTER — Other Ambulatory Visit: Payer: Self-pay | Admitting: Allergy and Immunology

## 2019-07-26 ENCOUNTER — Ambulatory Visit: Payer: Self-pay | Attending: Internal Medicine

## 2019-07-26 DIAGNOSIS — Z23 Encounter for immunization: Secondary | ICD-10-CM

## 2019-07-26 NOTE — Progress Notes (Signed)
   Covid-19 Vaccination Clinic  Name:  Cassandra Baxter    MRN: [id number] DOB: 01-09-1981  07/26/2019  [name] was observed post Covid-19 immunization for 15 minutes without incident. She was provided with Vaccine Information Sheet and instruction to access the V-Safe system.   [name] was instructed to call 911 with any severe reactions post vaccine: Marland Kitchen Difficulty breathing  . Swelling of face and throat  . A fast heartbeat  . A bad rash all over body  . Dizziness and weakness   Immunizations Administered    Name Date Dose VIS Date Route   Moderna COVID-19 Vaccine 07/26/2019  2:28 PM 0.5 mL 03/29/2019 Intramuscular   Manufacturer: Moderna   Lot: [id number]   [id number]

## 2019-07-28 ENCOUNTER — Other Ambulatory Visit: Payer: Self-pay | Admitting: Allergy and Immunology

## 2019-07-29 ENCOUNTER — Other Ambulatory Visit: Payer: Self-pay | Admitting: Allergy and Immunology

## 2019-07-29 MED ORDER — ALBUTEROL SULFATE HFA 108 (90 BASE) MCG/ACT IN AERS: 2.0000 | INHALATION_SPRAY | RESPIRATORY_TRACT | 1 refills | Status: DC | PRN

## 2019-07-29 NOTE — Telephone Encounter (Signed)
Patient called to request a one month refill of her medications until her follow up visit. She has one scheduled at the end of April, but she needs some help.  She needs to get the [place] fixed as well with the [place]. I will route to [facility] to see if she can fix it.   Salvatore Marvel, MD [facility] [place] [facility] [place]

## 2019-08-04 DIAGNOSIS — F332 Major depressive disorder, recurrent severe without psychotic features: Secondary | ICD-10-CM | POA: Diagnosis not present

## 2019-08-04 MED ORDER — XHANCE 93 MCG/ACT NA EXHU: 2.0000 | INHALANT_SUSPENSION | Freq: Two times a day (BID) | NASAL | 5 refills | Status: DC | PRN

## 2019-08-04 NOTE — Telephone Encounter (Signed)
Prescription sent to [facility] as [facility] is no longer preferred with Truett Perna,

## 2019-08-08 DIAGNOSIS — F332 Major depressive disorder, recurrent severe without psychotic features: Secondary | ICD-10-CM | POA: Diagnosis not present

## 2019-08-09 ENCOUNTER — Telehealth: Payer: Self-pay

## 2019-08-09 NOTE — Telephone Encounter (Signed)
Prior auth denied. I have sent denial information and clinical information to [employer] for the appeals process & letter to submit to insurance company.

## 2019-08-09 NOTE — Telephone Encounter (Signed)
Prior authorization for Cassandra Baxter has been submitted via covermymeds.

## 2019-08-10 NOTE — Telephone Encounter (Signed)
Appeal letter and supporting documentation has been submitted to insurance.

## 2019-08-12 DIAGNOSIS — T161XXA Foreign body in right ear, initial encounter: Secondary | ICD-10-CM | POA: Diagnosis not present

## 2019-08-16 DIAGNOSIS — F332 Major depressive disorder, recurrent severe without psychotic features: Secondary | ICD-10-CM | POA: Diagnosis not present

## 2019-08-17 MED ORDER — FLUNISOLIDE 25 MCG/ACT (0.025%) NA SOLN: 2.0000 | Freq: Every day | NASAL | 5 refills | Status: DC | PRN

## 2019-08-17 NOTE — Addendum Note (Signed)
Addended by: Lucrezia Starch I on: 08/17/2019 04:53 PM   Modules accepted: Orders

## 2019-08-17 NOTE — Telephone Encounter (Signed)
Appeal has been denied. Patient must try and fail flunisolide nasal spray to get [facility] covered as she has already tried/failed Flonase.

## 2019-08-17 NOTE — Telephone Encounter (Signed)
Yes, flunisolide, 2 sp per nostril qd prn. Thanks.

## 2019-08-17 NOTE — Telephone Encounter (Signed)
Prescription has been sent in and a detailed message left for patient advising her of medication change.

## 2019-08-17 NOTE — Telephone Encounter (Signed)
Dr. Verlin Fester is not back until next week. Do you want me to hold off until then?

## 2019-08-17 NOTE — Telephone Encounter (Signed)
Rounding note to Dr. Verlin Fester since this is his patient.  Cassandra Marvel, MD Allergy and [place] [facility] [place]

## 2019-08-18 DIAGNOSIS — F332 Major depressive disorder, recurrent severe without psychotic features: Secondary | ICD-10-CM | POA: Diagnosis not present

## 2019-08-20 ENCOUNTER — Other Ambulatory Visit: Payer: Self-pay | Admitting: Allergy & Immunology

## 2019-08-22 ENCOUNTER — Encounter: Payer: Self-pay | Admitting: Allergy and Immunology

## 2019-08-22 ENCOUNTER — Other Ambulatory Visit: Payer: Self-pay

## 2019-08-22 ENCOUNTER — Ambulatory Visit (INDEPENDENT_AMBULATORY_CARE_PROVIDER_SITE_OTHER): Payer: BC Managed Care – PPO | Admitting: Allergy and Immunology

## 2019-08-22 VITALS — BP 110/72 | HR 76 | Temp 97.9°F | Resp 16

## 2019-08-22 DIAGNOSIS — J3089 Other allergic rhinitis: Secondary | ICD-10-CM | POA: Diagnosis not present

## 2019-08-22 DIAGNOSIS — H1013 Acute atopic conjunctivitis, bilateral: Secondary | ICD-10-CM

## 2019-08-22 DIAGNOSIS — J4540 Moderate persistent asthma, uncomplicated: Secondary | ICD-10-CM | POA: Diagnosis not present

## 2019-08-22 DIAGNOSIS — K219 Gastro-esophageal reflux disease without esophagitis: Secondary | ICD-10-CM | POA: Diagnosis not present

## 2019-08-22 MED ORDER — OLOPATADINE HCL 0.2 % OP SOLN
1.0000 [drp] | Freq: Every day | OPHTHALMIC | 5 refills | Status: DC | PRN
Start: 2019-08-22 — End: 2022-05-09

## 2019-08-22 MED ORDER — SYMBICORT 160-4.5 MCG/ACT IN AERO: 2.0000 | INHALATION_SPRAY | Freq: Two times a day (BID) | RESPIRATORY_TRACT | 1 refills | Status: DC

## 2019-08-22 NOTE — Assessment & Plan Note (Signed)
   Treatment plan as outlined above for allergic rhinitis.  A refill prescription has been provided for generic Pataday, one drop per eye daily as needed.  I have also recommended eye lubricant drops (i.e., Natural Tears) as needed.

## 2019-08-22 NOTE — Assessment & Plan Note (Signed)
   Continue appropriate reflux lifestyle modifications.  Given the recent throat irritation, hoarseness, and globus sensation proceed with a therapeutic trial of omeprazole 20 mg daily and famotidine (Pepcid) 20 mg 2 times daily.  If symptoms improve, continue this regimen.  If symptoms are unchanged, may discontinue the omeprazole.  Follow-up with Dr. Wilburn Cornelia as scheduled.

## 2019-08-22 NOTE — Progress Notes (Signed)
Follow-up Note  RE: Cassandra Baxter MRN: [id number] DOB: 07/22/1980 Date of Office Visit: 08/22/2019  Primary care provider: Vernie Shanks, MD Referring provider: Vernie Shanks, MD  History of present illness: Cassandra Baxter is a 39 y.o. female with persistent asthma, rhinoconjunctivitis, and gastroesophageal reflux presenting today for follow-up.  She reports that she did experience some increased asthma symptoms and upper respiratory tract symptoms in the midst of remodeling/reconstruction of her home.  However, more recently, her asthma has been stable/well controlled with Symbicort 160-4.5 g, 2 inhalations twice daily.  She rarely requires albuterol rescue and has not been experiencing limitations in normal daily activities or nocturnal awakenings due to lower respiratory symptoms.  She reports that her spacer device is broken. She reports that she has been experiencing some nasal congestion as well as sinus pressure and postnasal drainage.  She was not able to start [facility] nasal spray because of insurance coverage issues.  She has been using Flonase.  She reports that her nasal and ocular allergy symptoms have increased as a result of cat dander exposure.  When her daughter comes home after staying with her biological mother with cats in her bedroom, Cassandra Baxter experiences nasal congestion, rhinorrhea, sneezing, nasal pruritus, and ocular pruritus.  She has recently started using olopatadine eyedrops after her daughter has been around cats.  She complains of irritated throat, hoarseness, and globus sensation.  She is currently not taking omeprazole or famotidine for acid reflux.  Assessment and plan: Moderate persistent asthma Stable.  Continue Symbicort 160-4.5 g, 2 inhalations twice daily.  A new spacer device has been provided.  Continue albuterol HFA, 1 to 2 inhalations every 4-6 hours if needed.  Subjective and objective measures of pulmonary function will be followed and  the treatment plan will be adjusted accordingly.  Perennial and seasonal allergic rhinitis  Continue appropriate aeroallergen avoidance measures and levocetirizine 5 mg daily as needed.  A sample and prescription has been provided for [facility], 2 sprays per nostril twice daily if needed.  If needed, may add azelastine nasal spray.  Nasal saline spray (i.e., Simply Saline) or nasal saline lavage (i.e., NeilMed) is recommended as needed and prior to medicated nasal sprays.  A NeilMed bottle has been provided.  For thick postnasal drainage, Mucinex 600 1200 mg daily as needed.  Allergic conjunctivitis  Treatment plan as outlined above for allergic rhinitis.  A refill prescription has been provided for generic Pataday, one drop per eye daily as needed.  I have also recommended eye lubricant drops (i.e., Natural Tears) as needed.  GERD (gastroesophageal reflux disease)  Continue appropriate reflux lifestyle modifications.  Given the recent throat irritation, hoarseness, and globus sensation proceed with a therapeutic trial of omeprazole 20 mg daily and famotidine (Pepcid) 20 mg 2 times daily.  If symptoms improve, continue this regimen.  If symptoms are unchanged, may discontinue the omeprazole.  Follow-up with Dr. Wilburn Cornelia as scheduled.   Meds ordered this encounter  Medications  . SYMBICORT 160-4.5 MCG/ACT inhaler    Sig: Inhale 2 puffs into the lungs 2 (two) times daily.    Dispense:  30.6 Inhaler    Refill:  1  . Olopatadine HCl (PATADAY) 0.2 % SOLN    Sig: Place 1 drop into both eyes daily as needed.    Dispense:  2.5 mL    Refill:  5    Diagnostics: Spirometry:  Normal with an FEV1 of 102% predicted. This study was performed while the patient was asymptomatic.  Please see scanned spirometry results for details.    Physical examination: Blood pressure 110/72, pulse 76, temperature 97.9 F (36.6 C), temperature source Temporal, resp. rate 16, SpO2 98 %.  General:  Alert, interactive, in no acute distress. HEENT: TMs pearly gray, turbinates moderately edematous with clear discharge, post-pharynx moderately erythematous. Neck: Supple without lymphadenopathy. Lungs: Clear to auscultation without wheezing, rhonchi or rales. CV: Normal S1, S2 without murmurs. Skin: Warm and dry, without lesions or rashes.  The following portions of the patient's history were reviewed and updated as appropriate: allergies, current medications, past family history, past medical history, past social history, past surgical history and problem list.  Current Outpatient Medications  Medication Sig Dispense Refill  . acetaminophen (TYLENOL) 500 MG tablet Take 1,000 mg by mouth 3 (three) times daily as needed (pain).    . ADDERALL XR 30 MG 24 hr capsule Take by mouth daily.    . ADZENYS XR-ODT 18.8 MG TBED Take 1 tablet by mouth every morning.    Marland Kitchen albuterol (VENTOLIN HFA) 108 (90 Base) MCG/ACT inhaler Inhale 2 puffs into the lungs every 4 (four) hours as needed (asthma symptoms). 18 g 1  . ALPRAZolam (XANAX) 0.5 MG tablet Take 0.5 mg by mouth at bedtime.    . budesonide-formoterol (SYMBICORT) 160-4.5 MCG/ACT inhaler Inhale 2 puffs into the lungs 2 (two) times daily. 1 Inhaler 5  . clonazePAM (KLONOPIN) 1 MG tablet TAKE 1 TABLET BY MOUTH 4 TIMES A DAY AS NEEDED    . Cyanocobalamin (VITAMIN B-12 PO) Take 1 tablet by mouth daily.    Marland Kitchen EPINEPHrine (EPIPEN 2-PAK) 0.3 mg/0.3 mL IJ SOAJ injection     . famotidine (PEPCID) 20 MG tablet Take 1 tablet (20 mg total) by mouth 2 (two) times daily as needed for heartburn or indigestion. 60 tablet 2  . flunisolide (NASALIDE) 25 MCG/ACT (0.025%) SOLN Place 2 sprays into the nose daily as needed. 25 mL 5  . fluticasone (FLONASE) 50 MCG/ACT nasal spray Place into the nose.    Marland Kitchen Fluticasone Propionate (XHANCE) 93 MCG/ACT EXHU Place 2 sprays into the nose 2 (two) times daily as needed. 32 mL 5  . FOLIC ACID PO Take 1 tablet by mouth daily.    Marland Kitchen  gabapentin (NEURONTIN) 300 MG capsule TAKE 2 CAPSULES BY MOUTH 2 3 TIMES A DAY    . Glucosamine HCl (GLUCOSAMINE PO) Take 1 tablet by mouth daily.    Marland Kitchen lamoTRIgine (LAMICTAL) 150 MG tablet Take 50 mg by mouth daily.     Marland Kitchen levocetirizine (XYZAL) 5 MG tablet TAKE 1 TABLET BY MOUTH EVERY DAY IN THE EVENING 30 tablet 1  . lisdexamfetamine (VYVANSE) 40 MG capsule 60 mg    . MAGNESIUM PO Take 1 tablet by mouth daily. otc    . norethindrone-ethinyl estradiol (JUNEL FE 1/20) 1-20 MG-MCG tablet     . omeprazole (PRILOSEC) 20 MG capsule TAKE 1 CAPSULE BY MOUTH EVERY DAY 90 capsule 0  . orphenadrine (NORFLEX) 100 MG tablet TAKE 1 TABLET BY MOUTH TWICE A DAY IN THE MORNING AND EVENING AS NEEDED    . prazosin (MINIPRESS) 1 MG capsule TAKE 4 CAPSULES BY MOUTH AT BEDTIME    . sertraline (ZOLOFT) 50 MG tablet Take 150 mg by mouth at bedtime.    . SYMBICORT 160-4.5 MCG/ACT inhaler Inhale 2 puffs into the lungs 2 (two) times daily. 30.6 Inhaler 1  . traMADol (ULTRAM) 50 MG tablet Take 50 mg by mouth daily as needed (pain).     Marland Kitchen  traZODone (DESYREL) 50 MG tablet TAKE 1 TO 3 TABLETS BY MOUTH AT BEDTIME    . TURMERIC CURCUMIN PO Take 1 capsule by mouth daily.    . Olopatadine HCl (PATADAY) 0.2 % SOLN Place 1 drop into both eyes daily as needed. 2.5 mL 5   No current facility-administered medications for this visit.    No Known Allergies  Review of systems: Review of systems negative except as noted in HPI / PMHx.  Past Medical History:  Diagnosis Date  . Anxiety   . Asthma     History reviewed. No pertinent family history.  Social History   Socioeconomic History  . Marital status: Single    Spouse name: Not on file  . Number of children: Not on file  . Years of education: Not on file  . Highest education level: Not on file  Occupational History  . Not on file  Tobacco Use  . Smoking status: Never Smoker  . Smokeless tobacco: Never Used  Substance and Sexual Activity  . Alcohol use: No  .  Drug use: Yes    Types: Marijuana  . Sexual activity: Not on file  Other Topics Concern  . Not on file  Social History Narrative  . Not on file   Social Determinants of Health   Financial Resource Strain:   . Difficulty of Paying Living Expenses:   Food Insecurity:   . Worried About Charity fundraiser in the Last Year:   . Arboriculturist in the Last Year:   Transportation Needs:   . Film/video editor (Medical):   Marland Kitchen Lack of Transportation (Non-Medical):   Physical Activity:   . Days of Exercise per Week:   . Minutes of Exercise per Session:   Stress:   . Feeling of Stress :   Social Connections:   . Frequency of Communication with Friends and Family:   . Frequency of Social Gatherings with Friends and Family:   . Attends Religious Services:   . Active Member of Clubs or Organizations:   . Attends Archivist Meetings:   Marland Kitchen Marital Status:   Intimate Partner Violence:   . Fear of Current or Ex-Partner:   . Emotionally Abused:   Marland Kitchen Physically Abused:   . Sexually Abused:     I appreciate the opportunity to take part in Alek's care. Please do not hesitate to contact me with questions.  Sincerely,   R. Edgar Frisk, MD

## 2019-08-22 NOTE — Assessment & Plan Note (Signed)
   Continue appropriate aeroallergen avoidance measures and levocetirizine 5 mg daily as needed.  A sample and prescription has been provided for [facility], 2 sprays per nostril twice daily if needed.  If needed, may add azelastine nasal spray.  Nasal saline spray (i.e., Simply Saline) or nasal saline lavage (i.e., NeilMed) is recommended as needed and prior to medicated nasal sprays.  A NeilMed bottle has been provided.  For thick postnasal drainage, Mucinex 600 1200 mg daily as needed.

## 2019-08-22 NOTE — Patient Instructions (Addendum)
Moderate persistent asthma Stable.  Continue Symbicort 160-4.5 g, 2 inhalations twice daily.  A new spacer device has been provided.  Continue albuterol HFA, 1 to 2 inhalations every 4-6 hours if needed.  Subjective and objective measures of pulmonary function will be followed and the treatment plan will be adjusted accordingly.  Perennial and seasonal allergic rhinitis  Continue appropriate aeroallergen avoidance measures and levocetirizine 5 mg daily as needed.  A sample and prescription has been provided for [facility], 2 sprays per nostril twice daily if needed.  If needed, may add azelastine nasal spray.  Nasal saline spray (i.e., Simply Saline) or nasal saline lavage (i.e., NeilMed) is recommended as needed and prior to medicated nasal sprays.  A NeilMed bottle has been provided.  For thick postnasal drainage, Mucinex 600 1200 mg daily as needed.  Allergic conjunctivitis  Treatment plan as outlined above for allergic rhinitis.  A refill prescription has been provided for generic Pataday, one drop per eye daily as needed.  I have also recommended eye lubricant drops (i.e., Natural Tears) as needed.  GERD (gastroesophageal reflux disease)  Continue appropriate reflux lifestyle modifications.  Given the recent throat irritation, hoarseness, and globus sensation proceed with a therapeutic trial of omeprazole 20 mg daily and famotidine (Pepcid) 20 mg 2 times daily.  If symptoms improve, continue this regimen.  If symptoms are unchanged, may discontinue the omeprazole.  Follow-up with Dr. Wilburn Cornelia as scheduled.   Return in about 4 months (around 12/22/2019), or if symptoms worsen or fail to improve.

## 2019-08-22 NOTE — Assessment & Plan Note (Signed)
Stable.  Continue Symbicort 160-4.5 g, 2 inhalations twice daily.  A new spacer device has been provided.  Continue albuterol HFA, 1 to 2 inhalations every 4-6 hours if needed.  Subjective and objective measures of pulmonary function will be followed and the treatment plan will be adjusted accordingly.

## 2019-08-23 DIAGNOSIS — F332 Major depressive disorder, recurrent severe without psychotic features: Secondary | ICD-10-CM | POA: Diagnosis not present

## 2019-08-24 ENCOUNTER — Other Ambulatory Visit: Payer: Self-pay | Admitting: Allergy and Immunology

## 2019-08-24 DIAGNOSIS — M5136 Other intervertebral disc degeneration, lumbar region: Secondary | ICD-10-CM | POA: Diagnosis not present

## 2019-08-24 DIAGNOSIS — G894 Chronic pain syndrome: Secondary | ICD-10-CM | POA: Diagnosis not present

## 2019-08-24 DIAGNOSIS — M545 Low back pain: Secondary | ICD-10-CM | POA: Diagnosis not present

## 2019-08-24 DIAGNOSIS — M791 Myalgia, unspecified site: Secondary | ICD-10-CM | POA: Diagnosis not present

## 2019-08-26 DIAGNOSIS — F332 Major depressive disorder, recurrent severe without psychotic features: Secondary | ICD-10-CM | POA: Diagnosis not present

## 2019-09-02 DIAGNOSIS — F332 Major depressive disorder, recurrent severe without psychotic features: Secondary | ICD-10-CM | POA: Diagnosis not present

## 2019-09-09 DIAGNOSIS — F332 Major depressive disorder, recurrent severe without psychotic features: Secondary | ICD-10-CM | POA: Diagnosis not present

## 2019-09-13 DIAGNOSIS — F332 Major depressive disorder, recurrent severe without psychotic features: Secondary | ICD-10-CM | POA: Diagnosis not present

## 2019-09-15 DIAGNOSIS — F332 Major depressive disorder, recurrent severe without psychotic features: Secondary | ICD-10-CM | POA: Diagnosis not present

## 2019-09-16 ENCOUNTER — Other Ambulatory Visit: Payer: Self-pay | Admitting: Allergy & Immunology

## 2019-09-16 ENCOUNTER — Other Ambulatory Visit: Payer: Self-pay | Admitting: Allergy and Immunology

## 2019-09-20 DIAGNOSIS — F332 Major depressive disorder, recurrent severe without psychotic features: Secondary | ICD-10-CM | POA: Diagnosis not present

## 2019-09-30 DIAGNOSIS — F4310 Post-traumatic stress disorder, unspecified: Secondary | ICD-10-CM | POA: Diagnosis not present

## 2019-09-30 DIAGNOSIS — F3341 Major depressive disorder, recurrent, in partial remission: Secondary | ICD-10-CM | POA: Diagnosis not present

## 2019-09-30 DIAGNOSIS — F332 Major depressive disorder, recurrent severe without psychotic features: Secondary | ICD-10-CM | POA: Diagnosis not present

## 2019-10-04 DIAGNOSIS — F332 Major depressive disorder, recurrent severe without psychotic features: Secondary | ICD-10-CM | POA: Diagnosis not present

## 2019-10-07 DIAGNOSIS — F332 Major depressive disorder, recurrent severe without psychotic features: Secondary | ICD-10-CM | POA: Diagnosis not present

## 2019-10-13 DIAGNOSIS — F332 Major depressive disorder, recurrent severe without psychotic features: Secondary | ICD-10-CM | POA: Diagnosis not present

## 2019-10-18 DIAGNOSIS — F332 Major depressive disorder, recurrent severe without psychotic features: Secondary | ICD-10-CM | POA: Diagnosis not present

## 2019-10-20 DIAGNOSIS — F332 Major depressive disorder, recurrent severe without psychotic features: Secondary | ICD-10-CM | POA: Diagnosis not present

## 2019-10-25 ENCOUNTER — Other Ambulatory Visit: Payer: Self-pay | Admitting: Allergy and Immunology

## 2019-10-29 ENCOUNTER — Other Ambulatory Visit: Payer: Self-pay | Admitting: Allergy and Immunology

## 2019-10-29 ENCOUNTER — Other Ambulatory Visit: Payer: Self-pay | Admitting: Allergy & Immunology

## 2019-11-29 ENCOUNTER — Other Ambulatory Visit: Payer: Self-pay | Admitting: Allergy and Immunology

## 2019-11-30 ENCOUNTER — Telehealth: Payer: Self-pay

## 2019-11-30 NOTE — Telephone Encounter (Signed)
Prior authorization for flunisolide submitted on covermymeds.

## 2019-12-02 NOTE — Telephone Encounter (Signed)
Pending

## 2019-12-05 NOTE — Telephone Encounter (Signed)
PA for Flunisolide has been denied, stating that at least 2 over the counter intranasal steroids have to be tried and failed. Suggesting Triamcinolone nasal spray or budesonide nasal spray be tried first, please advise.

## 2019-12-05 NOTE — Telephone Encounter (Signed)
Try triamcinolone nasal spray, 2 sprays per nostril daily as needed. Thanks.

## 2019-12-06 ENCOUNTER — Other Ambulatory Visit: Payer: Self-pay

## 2019-12-06 MED ORDER — TRIAMCINOLONE ACETONIDE 55 MCG/ACT NA AERO
2.0000 | INHALATION_SPRAY | Freq: Every day | NASAL | 5 refills | Status: DC | PRN
Start: 2019-12-06 — End: 2020-07-25

## 2019-12-06 NOTE — Telephone Encounter (Signed)
Sent in triamcinolone nasal spray lm for pt to call us back about the change

## 2019-12-21 ENCOUNTER — Ambulatory Visit: Payer: BC Managed Care – PPO | Admitting: Allergy

## 2019-12-21 NOTE — Progress Notes (Deleted)
Follow Up Note  RE: Cassandra Baxter MRN: [id number] DOB: December 17, 1980 Date of Office Visit: 12/21/2019  Referring provider: Vernie Shanks, MD Primary care provider: Vernie Shanks, MD  Chief Complaint: No chief complaint on file.  History of Present Illness: I had the pleasure of seeing Cassandra Baxter for a follow up visit at the [facility] [place] on 12/21/2019. She is a 39 y.o. female, who is being followed for asthma, allergic rhinoconjunctivitis and GERD. Her previous allergy office visit was on 08/22/2019 with Dr. Verlin Fester. Today is a regular follow up visit.  2020 skin testing was positive for grass, weeds, ragweed, tree, dust mites, cat, dog, horse, mold.  Moderate persistent asthma Stable.  Continue Symbicort 160-4.5 g, 2 inhalations twice daily.  A new spacer device has been provided.  Continue albuterol HFA, 1 to 2 inhalations every 4-6 hours if needed.  Subjective and objective measures of pulmonary function will be followed and the treatment plan will be adjusted accordingly.  Perennial and seasonal allergic rhinitis  Continue appropriate aeroallergen avoidance measures and levocetirizine 5 mg daily as needed.  A sample and prescription has been provided for [facility], 2 sprays per nostril twice daily if needed.  If needed, may add azelastine nasal spray.  Nasal saline spray (i.e., Simply Saline) or nasal saline lavage (i.e., NeilMed) is recommended as needed and prior to medicated nasal sprays.  A NeilMed bottle has been provided.  For thick postnasal drainage, Mucinex 600 1200 mg daily as needed.  Allergic conjunctivitis  Treatment plan as outlined above for allergic rhinitis.  A refill prescription has been provided for generic Pataday, one drop per eye daily as needed.  I have also recommended eye lubricant drops (i.e., Natural Tears) as needed.  GERD (gastroesophageal reflux disease)  Continue appropriate reflux lifestyle  modifications.  Given the recent throat irritation, hoarseness, and globus sensation proceed with a therapeutic trial of omeprazole 20 mg daily and famotidine (Pepcid) 20 mg 2 times daily.  If symptoms improve, continue this regimen.  If symptoms are unchanged, may discontinue the omeprazole.  Follow-up with Dr. Wilburn Cornelia as scheduled.  Assessment and Plan: Cassandra Baxter is a 39 y.o. female with: No problem-specific Assessment & Plan notes found for this encounter.  No follow-ups on file.  No orders of the defined types were placed in this encounter.  Lab Orders  No laboratory test(s) ordered today    Diagnostics: Spirometry:  Tracings reviewed. Her effort: {Blank single:19197::"Good reproducible efforts.","It was hard to get consistent efforts and there is a question as to whether this reflects a maximal maneuver.","Poor effort, data can not be interpreted."} FVC: ***L FEV1: ***L, ***% predicted FEV1/FVC ratio: ***% Interpretation: {Blank single:19197::"Spirometry consistent with mild obstructive disease","Spirometry consistent with moderate obstructive disease","Spirometry consistent with severe obstructive disease","Spirometry consistent with possible restrictive disease","Spirometry consistent with mixed obstructive and restrictive disease","Spirometry uninterpretable due to technique","Spirometry consistent with normal pattern","No overt abnormalities noted given today's efforts"}.  Please see scanned spirometry results for details.  Skin Testing: {Blank single:19197::"Select foods","Environmental allergy panel","Environmental allergy panel and select foods","Food allergy panel","None","Deferred due to recent antihistamines use"}. Positive test to: ***. Negative test to: ***.  Results discussed with patient/family.   Medication List:  Current Outpatient Medications  Medication Sig Dispense Refill  . acetaminophen (TYLENOL) 500 MG tablet Take 1,000 mg by mouth 3 (three) times  daily as needed (pain).    . ADDERALL XR 30 MG 24 hr capsule Take by mouth daily.    . ADZENYS XR-ODT 18.8 MG  TBED Take 1 tablet by mouth every morning.    Marland Kitchen albuterol (VENTOLIN HFA) 108 (90 Base) MCG/ACT inhaler INHALE 2 PUFFS INTO THE LUNGS EVERY 4 (FOUR) HOURS AS NEEDED (ASTHMA SYMPTOMS). 8.5 g 1  . ALPRAZolam (XANAX) 0.5 MG tablet Take 0.5 mg by mouth at bedtime.    . budesonide-formoterol (SYMBICORT) 160-4.5 MCG/ACT inhaler Inhale 2 puffs into the lungs 2 (two) times daily. 1 Inhaler 5  . clonazePAM (KLONOPIN) 1 MG tablet TAKE 1 TABLET BY MOUTH 4 TIMES A DAY AS NEEDED    . Cyanocobalamin (VITAMIN B-12 PO) Take 1 tablet by mouth daily.    Marland Kitchen EPINEPHrine (EPIPEN 2-PAK) 0.3 mg/0.3 mL IJ SOAJ injection     . famotidine (PEPCID) 20 MG tablet TAKE 1 TAB BY MOUTH 2 TIMES DAILY AS NEEDED FOR HEARTBURN OR INDIGESTION. *NOT COVERED BY INS* 60 tablet 5  . flunisolide (NASALIDE) 25 MCG/ACT (0.025%) SOLN Place 2 sprays into the nose daily as needed. 25 mL 5  . fluticasone (FLONASE) 50 MCG/ACT nasal spray Place into the nose.    Marland Kitchen Fluticasone Propionate (XHANCE) 93 MCG/ACT EXHU Place 2 sprays into the nose 2 (two) times daily as needed. 32 mL 5  . FOLIC ACID PO Take 1 tablet by mouth daily.    Marland Kitchen gabapentin (NEURONTIN) 300 MG capsule TAKE 2 CAPSULES BY MOUTH 2 3 TIMES A DAY    . Glucosamine HCl (GLUCOSAMINE PO) Take 1 tablet by mouth daily.    Marland Kitchen lamoTRIgine (LAMICTAL) 150 MG tablet Take 50 mg by mouth daily.     Marland Kitchen levocetirizine (XYZAL) 5 MG tablet TAKE 1 TABLET BY MOUTH EVERY DAY IN THE EVENING 30 tablet 5  . lisdexamfetamine (VYVANSE) 40 MG capsule 60 mg    . MAGNESIUM PO Take 1 tablet by mouth daily. otc    . norethindrone-ethinyl estradiol (JUNEL FE 1/20) 1-20 MG-MCG tablet     . Olopatadine HCl (PATADAY) 0.2 % SOLN Place 1 drop into both eyes daily as needed. 2.5 mL 5  . omeprazole (PRILOSEC) 20 MG capsule TAKE 1 CAPSULE BY MOUTH EVERY DAY 90 capsule 0  . orphenadrine (NORFLEX) 100 MG tablet TAKE 1  TABLET BY MOUTH TWICE A DAY IN THE MORNING AND EVENING AS NEEDED    . prazosin (MINIPRESS) 1 MG capsule TAKE 4 CAPSULES BY MOUTH AT BEDTIME    . sertraline (ZOLOFT) 50 MG tablet Take 150 mg by mouth at bedtime.    . SYMBICORT 160-4.5 MCG/ACT inhaler Inhale 2 puffs into the lungs 2 (two) times daily. 30.6 Inhaler 1  . traMADol (ULTRAM) 50 MG tablet Take 50 mg by mouth daily as needed (pain).     . traZODone (DESYREL) 50 MG tablet TAKE 1 TO 3 TABLETS BY MOUTH AT BEDTIME    . triamcinolone (NASACORT) 55 MCG/ACT AERO nasal inhaler Place 2 sprays into the nose daily as needed. 16.5 mL 5  . TURMERIC CURCUMIN PO Take 1 capsule by mouth daily.     No current facility-administered medications for this visit.   Allergies: No Known Allergies I reviewed her past medical history, social history, family history, and environmental history and no significant changes have been reported from her previous visit.  Review of Systems  Constitutional: Negative for appetite change, chills, fever and unexpected weight change.  HENT: Negative for congestion and rhinorrhea.   Eyes: Negative for itching.  Respiratory: Negative for cough, chest tightness, shortness of breath and wheezing.   Gastrointestinal: Negative for abdominal pain.  Skin: Negative for  rash.  Allergic/Immunologic: Positive for environmental allergies.  Neurological: Negative for headaches.   Objective: There were no vitals taken for this visit. There is no height or weight on file to calculate BMI. Physical Exam Vitals and nursing note reviewed.  Constitutional:      Appearance: Normal appearance. She is well-developed.  HENT:     Head: Normocephalic and atraumatic.     Right Ear: External ear normal.     Left Ear: External ear normal.     Nose: Nose normal.     Mouth/Throat:     Mouth: Mucous membranes are moist.     Pharynx: Oropharynx is clear.  Eyes:     Conjunctiva/sclera: Conjunctivae normal.  Cardiovascular:     Rate and  Rhythm: Normal rate and regular rhythm.     Heart sounds: Normal heart sounds. No murmur heard.   Pulmonary:     Effort: Pulmonary effort is normal.     Breath sounds: Normal breath sounds. No wheezing, rhonchi or rales.  Musculoskeletal:     Cervical back: Neck supple.  Skin:    General: Skin is warm.     Findings: No rash.  Neurological:     Mental Status: She is alert and oriented to person, place, and time.  Psychiatric:        Behavior: Behavior normal.    Previous notes and tests were reviewed. The plan was reviewed with the patient/family, and all questions/concerned were addressed.  It was my pleasure to see Cassandra Baxter today and participate in her care. Please feel free to contact me with any questions or concerns.  Sincerely,  Rexene Alberts, DO Allergy & Immunology  [facility] office: [phone] [facility] office: [place] office: [phone]

## 2020-02-01 ENCOUNTER — Other Ambulatory Visit: Payer: Self-pay | Admitting: Allergy & Immunology

## 2020-03-21 ENCOUNTER — Other Ambulatory Visit: Payer: Self-pay | Admitting: Student

## 2020-03-21 DIAGNOSIS — M7631 Iliotibial band syndrome, right leg: Secondary | ICD-10-CM

## 2020-05-01 ENCOUNTER — Other Ambulatory Visit: Payer: Self-pay | Admitting: Allergy and Immunology

## 2020-05-01 NOTE — Telephone Encounter (Signed)
Courtesy refill needs appointment for further refills.

## 2020-05-10 ENCOUNTER — Other Ambulatory Visit: Payer: Self-pay | Admitting: Allergy and Immunology

## 2020-05-30 ENCOUNTER — Other Ambulatory Visit: Payer: Self-pay | Admitting: Allergy and Immunology

## 2020-06-20 DIAGNOSIS — G894 Chronic pain syndrome: Secondary | ICD-10-CM | POA: Diagnosis not present

## 2020-06-20 DIAGNOSIS — Z79891 Long term (current) use of opiate analgesic: Secondary | ICD-10-CM | POA: Diagnosis not present

## 2020-06-20 DIAGNOSIS — Z79899 Other long term (current) drug therapy: Secondary | ICD-10-CM | POA: Diagnosis not present

## 2020-07-12 ENCOUNTER — Other Ambulatory Visit: Payer: Self-pay | Admitting: *Deleted

## 2020-07-12 MED ORDER — ALBUTEROL SULFATE HFA 108 (90 BASE) MCG/ACT IN AERS: 2.0000 | INHALATION_SPRAY | RESPIRATORY_TRACT | 0 refills | Status: DC | PRN

## 2020-07-24 NOTE — Progress Notes (Signed)
Follow Up Note  RE: Cassandra Baxter MRN: [id number] DOB: 12-09-1980 Date of Office Visit: 07/25/2020  Referring provider: Vernie Shanks, MD Primary care provider: Vernie Shanks, MD  Chief Complaint: Asthma (Has gotten worse has been out a month of Symbicort and singular - wheezing, congestion, post nasal drip ACT-16) and Cough (Has a cough with flem at night - thinks it due to not using Flonase at night like she use to )  History of Present Illness: I had the pleasure of seeing Cassandra Baxter for a follow up visit at the [facility] [facility] [place] on 07/25/2020. She is a 40 y.o. female, who is being followed for asthma, allergic rhinoconjunctivitis and GERD. Her previous allergy office visit was on 08/22/2019 with Dr. Verlin Fester. Today is a regular follow up visit.  Moderate persistent asthma ACT score 16. Patient ran out of Symbicort 190mcg 2 puffs twice a day about 3 months ago. She has been noticing some chest congestion and wheezing. Patient did not use albuterol as it causes palpitations and she has some underlying anxiety.   Allergic rhino conjunctivitis Patient ran out of Xyzal and noticing symptoms. Using saline nasal spray and Flonase 1 spray per nostril BID.   Patient stopped allergy injections in the past as she had some trauma with her previous allergist and it causes her some anxiety to come to doctor's offices.   GERD (gastroesophageal reflux disease) Takes famotidine 20mg  1-2 times a day and omeprazole 20mg  daily with good benefit.   Assessment and Plan: Cassandra Baxter is a 40 y.o. female with: Moderate persistent asthma Not well controlled as she ran out of Symbicort 3 months ago. Does not like to use albuterol due to palpitations with her underlying anxiety.   ACT score 16.  Today's spirometry showed some mild obstruction with 20% improvement in FEV1 post bronchodilator treatment.  Clinically feeling improved. . Daily controller medication(s): RESTART Symbicort  156mcg 2 puffs twice a day with spacer and rinse mouth afterwards. Marland Kitchen Spacer given and demonstrated proper use with inhaler. Patient understood technique and all questions/concerned were addressed.  . May use levpalbuterol rescue inhaler 2 puffs every 4 to 6 hours as needed for shortness of breath, chest tightness, coughing, and wheezing. May use levoalbuterol rescue inhaler 2 puffs 5 to 15 minutes prior to strenuous physical activities. Monitor frequency of use.  o The levoalbuterol rescue inhaler should not make you have heart palpitations.   Repeat spirometry at next visit.  Seasonal and perennial allergic rhinoconjunctivitis Past history - 09/13/2018 skin testing was positive to grass, weeds, ragweed, tree, mold, dust mites, cat, dog and horse. Interim history - ran out of Xyzal and having increasing symptoms. Interested in starting AIT again.  Continue environmental control measures.   May use over the counter antihistamines such as Xyzal (levocetirizine) daily as needed.  May use Flonase (fluticasone) nasal spray 1 spray per nostril twice a day as needed for nasal congestion.   Nasal saline spray (i.e., Simply Saline) or nasal saline lavage (i.e., NeilMed) is recommended as needed and prior to medicated nasal sprays.  Will start AIT once asthma is in better control.   Read about allergy injections - handout given.   GERD (gastroesophageal reflux disease) Stable with below regimen.  See below for lifestyle and dietary modifications.  Continue famotidine 20mg  1-2 times a day.  Continue omeprazole 20mg  daily - nothing to eat or drink for 30 minutes afterwards.   Return in about 2 months (around 09/24/2020).  Meds  ordered this encounter  Medications  . budesonide-formoterol (SYMBICORT) 160-4.5 MCG/ACT inhaler    Sig: Inhale 2 puffs into the lungs in the morning and at bedtime. with spacer and rinse mouth afterwards.    Dispense:  1 each    Refill:  5  . levalbuterol (XOPENEX  HFA) 45 MCG/ACT inhaler    Sig: Inhale 2 puffs into the lungs every 4 (four) hours as needed for wheezing or shortness of breath (coughing).    Dispense:  1 each    Refill:  3  . omeprazole (PRILOSEC) 20 MG capsule    Sig: Take 1 capsule (20 mg total) by mouth daily.    Dispense:  30 capsule    Refill:  5  . famotidine (PEPCID) 20 MG tablet    Sig: Take 1 tablet (20 mg total) by mouth 2 (two) times daily.    Dispense:  60 tablet    Refill:  5  . levocetirizine (XYZAL) 5 MG tablet    Sig: Take 1 tablet (5 mg total) by mouth every evening.    Dispense:  30 tablet    Refill:  5  . fluticasone (FLONASE) 50 MCG/ACT nasal spray    Sig: Place 1 spray into both nostrils 2 (two) times daily as needed (nasal congestion).    Dispense:  16 g    Refill:  5   Lab Orders  No laboratory test(s) ordered today    Diagnostics: Spirometry:  Tracings reviewed. Her effort: Good reproducible efforts. FVC: 4.24L FEV1: 2.84L, 78% predicted FEV1/FVC ratio: 67% Interpretation: Spirometry consistent with mild obstructive disease with 20% improvement in FEV1 post bronchodilator treatment.  Clinically feeling improved. Please see scanned spirometry results for details.  Medication List:  Current Outpatient Medications  Medication Sig Dispense Refill  . acetaminophen (TYLENOL) 500 MG tablet Take 1,000 mg by mouth 3 (three) times daily as needed (pain).    . ADDERALL XR 30 MG 24 hr capsule Take by mouth daily.    . budesonide-formoterol (SYMBICORT) 160-4.5 MCG/ACT inhaler Inhale 2 puffs into the lungs in the morning and at bedtime. with spacer and rinse mouth afterwards. 1 each 5  . clonazePAM (KLONOPIN) 1 MG tablet TAKE 1 TABLET BY MOUTH 4 TIMES A DAY AS NEEDED    . EPINEPHrine 0.3 mg/0.3 mL IJ SOAJ injection     . famotidine (PEPCID) 20 MG tablet Take 1 tablet (20 mg total) by mouth 2 (two) times daily. 60 tablet 5  . fluticasone (FLONASE) 50 MCG/ACT nasal spray Place 1 spray into both nostrils 2 (two)  times daily as needed (nasal congestion). 16 g 5  . gabapentin (NEURONTIN) 300 MG capsule TAKE 2 CAPSULES BY MOUTH 2 3 TIMES A DAY    . lamoTRIgine (LAMICTAL) 150 MG tablet Take 50 mg by mouth daily.     Marland Kitchen levalbuterol (XOPENEX HFA) 45 MCG/ACT inhaler Inhale 2 puffs into the lungs every 4 (four) hours as needed for wheezing or shortness of breath (coughing). 1 each 3  . levocetirizine (XYZAL) 5 MG tablet Take 1 tablet (5 mg total) by mouth every evening. 30 tablet 5  . norethindrone-ethinyl estradiol (LOESTRIN FE) 1-20 MG-MCG tablet     . Olopatadine HCl (PATADAY) 0.2 % SOLN Place 1 drop into both eyes daily as needed. 2.5 mL 5  . omeprazole (PRILOSEC) 20 MG capsule Take 1 capsule (20 mg total) by mouth daily. 30 capsule 5  . orphenadrine (NORFLEX) 100 MG tablet TAKE 1 TABLET BY MOUTH TWICE A DAY IN  THE MORNING AND EVENING AS NEEDED    . prazosin (MINIPRESS) 1 MG capsule TAKE 4 CAPSULES BY MOUTH AT BEDTIME    . sertraline (ZOLOFT) 50 MG tablet Take 150 mg by mouth at bedtime.    . traMADol (ULTRAM) 50 MG tablet Take 50 mg by mouth daily as needed (pain).     . traZODone (DESYREL) 50 MG tablet TAKE 1 TO 3 TABLETS BY MOUTH AT BEDTIME    . ADZENYS XR-ODT 18.8 MG TBED Take 1 tablet by mouth every morning. (Patient not taking: Reported on 07/25/2020)    . ALPRAZolam (XANAX) 0.5 MG tablet Take 0.5 mg by mouth at bedtime. (Patient not taking: Reported on 07/25/2020)    . Cyanocobalamin (VITAMIN B-12 PO) Take 1 tablet by mouth daily. (Patient not taking: Reported on 07/25/2020)    . FOLIC ACID PO Take 1 tablet by mouth daily. (Patient not taking: Reported on 07/25/2020)    . Glucosamine HCl (GLUCOSAMINE PO) Take 1 tablet by mouth daily. (Patient not taking: Reported on 07/25/2020)    . lisdexamfetamine (VYVANSE) 40 MG capsule 60 mg (Patient not taking: Reported on 07/25/2020)    . MAGNESIUM PO Take 1 tablet by mouth daily. otc (Patient not taking: Reported on 07/25/2020)    . TURMERIC CURCUMIN PO Take 1 capsule  by mouth daily. (Patient not taking: Reported on 07/25/2020)     No current facility-administered medications for this visit.   Allergies: No Known Allergies I reviewed her past medical history, social history, family history, and environmental history and no significant changes have been reported from her previous visit.  Review of Systems  Constitutional: Negative for appetite change, chills, fever and unexpected weight change.  HENT: Negative for congestion and rhinorrhea.   Eyes: Negative for itching.  Respiratory: Positive for wheezing. Negative for cough, chest tightness and shortness of breath.   Skin: Negative for rash.  Allergic/Immunologic: Positive for environmental allergies.  Neurological: Negative for headaches.   Objective: BP 128/80 (BP Location: Left Arm, Patient Position: Sitting, Cuff Size: Large)   Pulse 92   Temp 98 F (36.7 C)   Resp 16   Ht 5\' 11"  (1.803 m)   Wt 209 lb 6.4 oz (95 kg)   SpO2 99%   BMI 29.21 kg/m  Body mass index is 29.21 kg/m. Physical Exam Vitals and nursing note reviewed.  Constitutional:      Appearance: Normal appearance. She is well-developed.  HENT:     Head: Normocephalic and atraumatic.     Right Ear: External ear normal.     Left Ear: External ear normal.     Nose: Nose normal.     Mouth/Throat:     Mouth: Mucous membranes are moist.     Pharynx: Oropharynx is clear.  Eyes:     Conjunctiva/sclera: Conjunctivae normal.  Cardiovascular:     Rate and Rhythm: Normal rate and regular rhythm.     Heart sounds: Normal heart sounds. No murmur heard.   Pulmonary:     Effort: Pulmonary effort is normal.     Breath sounds: Normal breath sounds. No wheezing, rhonchi or rales.  Musculoskeletal:     Cervical back: Neck supple.  Skin:    General: Skin is warm.     Findings: No rash.  Neurological:     Mental Status: She is alert and oriented to person, place, and time.  Psychiatric:        Behavior: Behavior normal.     Previous notes and tests were  reviewed. The plan was reviewed with the patient/family, and all questions/concerned were addressed.  It was my pleasure to see Cassandra Baxter today and participate in her care. Please feel free to contact me with any questions or concerns.  Sincerely,  Rexene Alberts, DO Allergy & Immunology  [facility] office: [place] office: [phone]

## 2020-07-25 ENCOUNTER — Encounter: Payer: Self-pay | Admitting: Allergy

## 2020-07-25 ENCOUNTER — Other Ambulatory Visit: Payer: Self-pay

## 2020-07-25 ENCOUNTER — Ambulatory Visit (INDEPENDENT_AMBULATORY_CARE_PROVIDER_SITE_OTHER): Payer: BC Managed Care – PPO | Admitting: Allergy

## 2020-07-25 VITALS — BP 128/80 | HR 92 | Temp 98.0°F | Resp 16 | Ht 71.0 in | Wt 209.4 lb

## 2020-07-25 DIAGNOSIS — J302 Other seasonal allergic rhinitis: Secondary | ICD-10-CM

## 2020-07-25 DIAGNOSIS — H1010 Acute atopic conjunctivitis, unspecified eye: Secondary | ICD-10-CM

## 2020-07-25 DIAGNOSIS — J3089 Other allergic rhinitis: Secondary | ICD-10-CM

## 2020-07-25 DIAGNOSIS — K219 Gastro-esophageal reflux disease without esophagitis: Secondary | ICD-10-CM

## 2020-07-25 DIAGNOSIS — J4540 Moderate persistent asthma, uncomplicated: Secondary | ICD-10-CM

## 2020-07-25 DIAGNOSIS — H1013 Acute atopic conjunctivitis, bilateral: Secondary | ICD-10-CM | POA: Diagnosis not present

## 2020-07-25 MED ORDER — BUDESONIDE-FORMOTEROL FUMARATE 160-4.5 MCG/ACT IN AERO: 2.0000 | INHALATION_SPRAY | Freq: Two times a day (BID) | RESPIRATORY_TRACT | 5 refills | Status: DC

## 2020-07-25 MED ORDER — LEVOCETIRIZINE DIHYDROCHLORIDE 5 MG PO TABS: 5.0000 mg | ORAL_TABLET | Freq: Every evening | ORAL | 5 refills | Status: DC

## 2020-07-25 MED ORDER — FAMOTIDINE 20 MG PO TABS
20.0000 mg | ORAL_TABLET | Freq: Two times a day (BID) | ORAL | 5 refills | Status: DC
Start: 2020-07-25 — End: 2021-05-02

## 2020-07-25 MED ORDER — OMEPRAZOLE 20 MG PO CPDR: 20.0000 mg | DELAYED_RELEASE_CAPSULE | Freq: Every day | ORAL | 5 refills | Status: DC

## 2020-07-25 MED ORDER — FLUTICASONE PROPIONATE 50 MCG/ACT NA SUSP: 1.0000 | Freq: Two times a day (BID) | NASAL | 5 refills | Status: DC | PRN

## 2020-07-25 MED ORDER — LEVALBUTEROL TARTRATE 45 MCG/ACT IN AERO: 2.0000 | INHALATION_SPRAY | RESPIRATORY_TRACT | 3 refills | Status: DC | PRN

## 2020-07-25 NOTE — Assessment & Plan Note (Addendum)
Not well controlled as she ran out of Symbicort 3 months ago. Does not like to use albuterol due to palpitations with her underlying anxiety.   ACT score 16.  Today's spirometry showed some mild obstruction with 20% improvement in FEV1 post bronchodilator treatment.  Clinically feeling improved. . Daily controller medication(s): RESTART Symbicort 173mcg 2 puffs twice a day with spacer and rinse mouth afterwards. Marland Kitchen Spacer given and demonstrated proper use with inhaler. Patient understood technique and all questions/concerned were addressed.  . May use levpalbuterol rescue inhaler 2 puffs every 4 to 6 hours as needed for shortness of breath, chest tightness, coughing, and wheezing. May use levoalbuterol rescue inhaler 2 puffs 5 to 15 minutes prior to strenuous physical activities. Monitor frequency of use.  o The levoalbuterol rescue inhaler should not make you have heart palpitations.   Repeat spirometry at next visit.

## 2020-07-25 NOTE — Assessment & Plan Note (Signed)
Stable with below regimen.  See below for lifestyle and dietary modifications.  Continue famotidine 20mg  1-2 times a day.  Continue omeprazole 20mg  daily - nothing to eat or drink for 30 minutes afterwards.

## 2020-07-25 NOTE — Patient Instructions (Addendum)
Asthma: . Daily controller medication(s): RESTART Symbicort 1106mcg 2 puffs twice a day with spacer and rinse mouth afterwards. Marland Kitchen Spacer given and demonstrated proper use with inhaler. Patient understood technique and all questions/concerned were addressed.   . May use levpalbuterol rescue inhaler 2 puffs every 4 to 6 hours as needed for shortness of breath, chest tightness, coughing, and wheezing. May use levoalbuterol rescue inhaler 2 puffs 5 to 15 minutes prior to strenuous physical activities. Monitor frequency of use.  o The levoalbuterol rescue inhaler should not make you have heart palpitations.  . Asthma control goals:  o Full participation in all desired activities (may need albuterol before activity) o Albuterol use two times or less a week on average (not counting use with activity) o Cough interfering with sleep two times or less a month o Oral steroids no more than once a year o No hospitalizations  Allergic rhino conjunctivitis:  Past skin testing was positive to grass, weeds, ragweed, tree, mold, dust mites, cat, dog and horse.  Continue environmental control measures.   May use over the counter antihistamines such as Xyzal (levocetirizine) daily as needed.  May use Flonase (fluticasone) nasal spray 1 spray per nostril twice a day as needed for nasal congestion.   Nasal saline spray (i.e., Simply Saline) or nasal saline lavage (i.e., NeilMed) is recommended as needed and prior to medicated nasal sprays.  Read about allergy injections - handout given.   GERD:  See below for lifestyle and dietary modifications.  Continue famotidine 20mg  1-2 times a day.  Continue omeprazole 20mg  daily - nothing to eat or drink for 30 minutes afterwards.   Follow up in 2 months or sooner if needed.  Reducing Pollen Exposure . Pollen seasons: trees (spring), grass (summer) and ragweed/weeds (fall). Marland Kitchen Keep windows closed in your home and car to lower pollen exposure.  Susa Simmonds air  conditioning in the bedroom and throughout the house if possible.  . Avoid going out in dry windy days - especially early morning. . Pollen counts are highest between 5 - 10 AM and on dry, hot and windy days.  . Save outside activities for late afternoon or after a heavy rain, when pollen levels are lower.  . Avoid mowing of grass if you have grass pollen allergy. Marland Kitchen Be aware that pollen can also be transported indoors on people and pets.  . Dry your clothes in an automatic dryer rather than hanging them outside where they might collect pollen.  . Rinse hair and eyes before bedtime. Mold Control . Mold and fungi can grow on a variety of surfaces provided certain temperature and moisture conditions exist.  . Outdoor molds grow on plants, decaying vegetation and soil. The major outdoor mold, Alternaria and Cladosporium, are found in very high numbers during hot and dry conditions. Generally, a late summer - fall peak is seen for common outdoor fungal spores. Rain will temporarily lower outdoor mold spore count, but counts rise rapidly when the rainy period ends. . The most important indoor molds are Aspergillus and Penicillium. Dark, humid and poorly ventilated basements are ideal sites for mold growth. The next most common sites of mold growth are the bathroom and the kitchen. Outdoor (Seasonal) Mold Control . Use air conditioning and keep windows closed. . Avoid exposure to decaying vegetation. Marland Kitchen Avoid leaf raking. . Avoid grain handling. . Consider wearing a face mask if working in moldy areas.  Indoor (Perennial) Mold Control  . Maintain humidity below 50%. . Get rid  of mold growth on hard surfaces with water, detergent and, if necessary, 5% bleach (do not mix with other cleaners). Then dry the area completely. If mold covers an area more than 10 square feet, consider hiring an indoor environmental professional. . For clothing, washing with soap and water is best. If moldy items cannot be  cleaned and dried, throw them away. . Remove sources e.g. contaminated carpets. . Repair and seal leaking roofs or pipes. Using dehumidifiers in damp basements may be helpful, but empty the water and clean units regularly to prevent mildew from forming. All rooms, especially basements, bathrooms and kitchens, require ventilation and cleaning to deter mold and mildew growth. Avoid carpeting on concrete or damp floors, and storing items in damp areas. Control of House Dust Mite Allergen . Dust mite allergens are a common trigger of allergy and asthma symptoms. While they can be found throughout the house, these microscopic creatures thrive in warm, humid environments such as bedding, upholstered furniture and carpeting. . Because so much time is spent in the bedroom, it is essential to reduce mite levels there.  . Encase pillows, mattresses, and box springs in special allergen-proof fabric covers or airtight, zippered plastic covers.  . Bedding should be washed weekly in hot water (130 F) and dried in a hot dryer. Allergen-proof covers are available for comforters and pillows that can't be regularly washed.  Wendee Copp the allergy-proof covers every few months. Minimize clutter in the bedroom. Keep pets out of the bedroom.  Marland Kitchen Keep humidity less than 50% by using a dehumidifier or air conditioning. You can buy a humidity measuring device called a hygrometer to monitor this.  . If possible, replace carpets with hardwood, linoleum, or washable area rugs. If that's not possible, vacuum frequently with a vacuum that has a HEPA filter. . Remove all upholstered furniture and non-washable window drapes from the bedroom. . Remove all non-washable stuffed toys from the bedroom.  Wash stuffed toys weekly. Pet Allergen Avoidance: . Contrary to popular opinion, there are no "hypoallergenic" breeds of dogs or cats. That is because people are not allergic to an animal's hair, but to an allergen found in the animal's  saliva, dander (dead skin flakes) or urine. Pet allergy symptoms typically occur within minutes. For some people, symptoms can build up and become most severe 8 to 12 hours after contact with the animal. People with severe allergies can experience reactions in public places if dander has been transported on the pet owners' clothing. Marland Kitchen Keeping an animal outdoors is only a partial solution, since homes with pets in the yard still have higher concentrations of animal allergens. . Before getting a pet, ask your allergist to determine if you are allergic to animals. If your pet is already considered part of your family, try to minimize contact and keep the pet out of the bedroom and other rooms where you spend a great deal of time. . As with dust mites, vacuum carpets often or replace carpet with a hardwood floor, tile or linoleum. . High-efficiency particulate air (HEPA) cleaners can reduce allergen levels over time. . While dander and saliva are the source of cat and dog allergens, urine is the source of allergens from rabbits, hamsters, mice and [place] pigs; so ask a non-allergic family member to clean the animal's cage. . If you have a pet allergy, talk to your allergist about the potential for allergy immunotherapy (allergy shots). This strategy can often provide long-term relief.   Gastroesophageal Reflux Disease, Adult  Gastroesophageal reflux (GER) happens when acid from the stomach flows up into the tube that connects the mouth and the stomach (esophagus). Normally, food travels down the esophagus and stays in the stomach to be digested. With GER, food and stomach acid sometimes move back up into the esophagus. You may have a disease called gastroesophageal reflux disease (GERD) if the reflux:  Happens often.  Causes frequent or very bad symptoms.  Causes problems such as damage to the esophagus. When this happens, the esophagus becomes sore and swollen. Over time, GERD can make small holes  (ulcers) in the lining of the esophagus. What are the causes? This condition is caused by a problem with the muscle between the esophagus and the stomach. When this muscle is weak or not normal, it does not close properly to keep food and acid from coming back up from the stomach. The muscle can be weak because of:  Tobacco use.  Pregnancy.  Having a certain type of hernia (hiatal hernia).  Alcohol use.  Certain foods and drinks, such as coffee, chocolate, onions, and peppermint. What increases the risk?  Being overweight.  Having a disease that affects your connective tissue.  Taking NSAIDs, such a ibuprofen. What are the signs or symptoms?  Heartburn.  Difficult or painful swallowing.  The feeling of having a lump in the throat.  A bitter taste in the mouth.  Bad breath.  Having a lot of saliva.  Having an upset or bloated stomach.  Burping.  Chest pain. Different conditions can cause chest pain. Make sure you see your doctor if you have chest pain.  Shortness of breath or wheezing.  A long-term cough or a cough at night.  Wearing away of the surface of teeth (tooth enamel).  Weight loss. How is this treated?  Making changes to your diet.  Taking medicine.  Having surgery. Treatment will depend on how bad your symptoms are. Follow these instructions at home: Eating and drinking  Follow a diet as told by your doctor. You may need to avoid foods and drinks such as: ? Coffee and tea, with or without caffeine. ? Drinks that contain alcohol. ? Energy drinks and sports drinks. ? Bubbly (carbonated) drinks or sodas. ? Chocolate and cocoa. ? Peppermint and mint flavorings. ? Garlic and onions. ? Horseradish. ? Spicy and acidic foods. These include peppers, chili powder, curry powder, vinegar, hot sauces, and BBQ sauce. ? Citrus fruit juices and citrus fruits, such as oranges, lemons, and limes. ? Tomato-based foods. These include red sauce, chili,  salsa, and pizza with red sauce. ? Fried and fatty foods. These include donuts, french fries, potato chips, and high-fat dressings. ? High-fat meats. These include hot dogs, rib eye steak, sausage, ham, and bacon. ? High-fat dairy items, such as whole milk, butter, and cream cheese.  Eat small meals often. Avoid eating large meals.  Avoid drinking large amounts of liquid with your meals.  Avoid eating meals during the 2-3 hours before bedtime.  Avoid lying down right after you eat.  Do not exercise right after you eat.   Lifestyle  Do not smoke or use any products that contain nicotine or tobacco. If you need help quitting, ask your doctor.  Try to lower your stress. If you need help doing this, ask your doctor.  If you are overweight, lose an amount of weight that is healthy for you. Ask your doctor about a safe weight loss goal.   General instructions  Pay attention to any  changes in your symptoms.  Take over-the-counter and prescription medicines only as told by your doctor.  Do not take aspirin, ibuprofen, or other NSAIDs unless your doctor says it is okay.  Wear loose clothes. Do not wear anything tight around your waist.  Raise (elevate) the head of your bed about 6 inches (15 cm). You may need to use a wedge to do this.  Avoid bending over if this makes your symptoms worse.  Keep all follow-up visits. Contact a doctor if:  You have new symptoms.  You lose weight and you do not know why.  You have trouble swallowing or it hurts to swallow.  You have wheezing or a cough that keeps happening.  You have a hoarse voice.  Your symptoms do not get better with treatment. Get help right away if:  You have sudden pain in your arms, neck, jaw, teeth, or back.  You suddenly feel sweaty, dizzy, or light-headed.  You have chest pain or shortness of breath.  You vomit and the vomit is green, yellow, or black, or it looks like blood or coffee grounds.  You  faint.  Your poop (stool) is red, bloody, or black.  You cannot swallow, drink, or eat. These symptoms may represent a serious problem that is an emergency. Do not wait to see if the symptoms will go away. Get medical help right away. Call your local emergency services (911 in the [place] Do not drive yourself to the hospital. Summary  If a person has gastroesophageal reflux disease (GERD), food and stomach acid move back up into the esophagus and cause symptoms or problems such as damage to the esophagus.  Treatment will depend on how bad your symptoms are.  Follow a diet as told by your doctor.  Take all medicines only as told by your doctor. This information is not intended to replace advice given to you by your health care provider. Make sure you discuss any questions you have with your health care provider. Document Revised: 10/24/2019 Document Reviewed: 10/24/2019 Elsevier Patient Education  [place].

## 2020-07-25 NOTE — Assessment & Plan Note (Addendum)
Past history - 09/13/2018 skin testing was positive to grass, weeds, ragweed, tree, mold, dust mites, cat, dog and horse. Interim history - ran out of Xyzal and having increasing symptoms. Interested in starting AIT again.  Continue environmental control measures.   May use over the counter antihistamines such as Xyzal (levocetirizine) daily as needed.  May use Flonase (fluticasone) nasal spray 1 spray per nostril twice a day as needed for nasal congestion.   Nasal saline spray (i.e., Simply Saline) or nasal saline lavage (i.e., NeilMed) is recommended as needed and prior to medicated nasal sprays.  Will start AIT once asthma is in better control.   Read about allergy injections - handout given.

## 2020-08-16 ENCOUNTER — Other Ambulatory Visit: Payer: Self-pay | Admitting: Allergy & Immunology

## 2020-08-22 DIAGNOSIS — M5136 Other intervertebral disc degeneration, lumbar region: Secondary | ICD-10-CM | POA: Diagnosis not present

## 2020-08-22 DIAGNOSIS — G894 Chronic pain syndrome: Secondary | ICD-10-CM | POA: Diagnosis not present

## 2020-09-12 DIAGNOSIS — F900 Attention-deficit hyperactivity disorder, predominantly inattentive type: Secondary | ICD-10-CM | POA: Diagnosis not present

## 2020-09-12 DIAGNOSIS — F422 Mixed obsessional thoughts and acts: Secondary | ICD-10-CM | POA: Diagnosis not present

## 2020-09-12 DIAGNOSIS — F4310 Post-traumatic stress disorder, unspecified: Secondary | ICD-10-CM | POA: Diagnosis not present

## 2020-09-12 DIAGNOSIS — F410 Panic disorder [episodic paroxysmal anxiety] without agoraphobia: Secondary | ICD-10-CM | POA: Diagnosis not present

## 2020-09-27 NOTE — Progress Notes (Deleted)
Follow Up Note  RE: Cassandra Baxter MRN: [id number] DOB: 07-05-1980 Date of Office Visit: 09/28/2020  Referring provider: Vernie Shanks, MD Primary care provider: Vernie Shanks, MD  Chief Complaint: No chief complaint on file.  History of Present Illness: I had the pleasure of seeing Cassandra Baxter for a follow up visit at the [facility] on 09/27/2020. She is a 40 y.o. female, who is being followed for asthma, allergic rhinoconjunctivitis and GERD. Her previous allergy office visit was on 07/25/2020 with Dr. Maudie Mercury. Today is a regular follow up visit.  Moderate persistent asthma Not well controlled as she ran out of Symbicort 3 months ago. Does not like to use albuterol due to palpitations with her underlying anxiety.   ACT score 16.  Today's spirometry showed some mild obstruction with 20% improvement in FEV1 post bronchodilator treatment.  Clinically feeling improved.  Daily controller medication(s):RESTART Symbicort 163mcg 2 puffs twice a day with spacer and rinse mouth afterwards.  Spacer given and demonstrated proper use with inhaler. Patient understood technique and all questions/concerned were addressed.   May use levpalbuterol rescue inhaler 2 puffs every 4 to 6 hours as needed for shortness of breath, chest tightness, coughing, and wheezing. May use levoalbuterol rescue inhaler 2 puffs 5 to 15 minutes prior to strenuous physical activities. Monitor frequency of use.  ? The levoalbuterol rescue inhaler should not make you have heart palpitations.   Repeat spirometry at next visit.  Seasonal and perennial allergic rhinoconjunctivitis Past history - 09/13/2018 skin testing was positive to grass, weeds, ragweed, tree, mold, dust mites, cat, dog and horse. Interim history - ran out of Xyzal and having increasing symptoms. Interested in starting AIT again.  Continue environmental control measures.   May use over the counter antihistamines such as Xyzal  (levocetirizine) daily as needed.  May use Flonase (fluticasone) nasal spray 1 spray per nostril twice a day as needed for nasal congestion.   Nasal saline spray (i.e., Simply Saline) or nasal saline lavage (i.e., NeilMed) is recommended as needed and prior to medicated nasal sprays.  Will start AIT once asthma is in better control.   Read about allergy injections - handout given.   GERD (gastroesophageal reflux disease) Stable with below regimen.  See below for lifestyle and dietary modifications.  Continue famotidine 20mg  1-2 times a day.  Continue omeprazole 20mg  daily - nothing to eat or drink for 30 minutes afterwards.   Return in about 2 months (around 09/24/2020).  Assessment and Plan: Cassandra Baxter is a 40 y.o. female with: No problem-specific Assessment & Plan notes found for this encounter.  No follow-ups on file.  No orders of the defined types were placed in this encounter.  Lab Orders  No laboratory test(s) ordered today    Diagnostics: Spirometry:  Tracings reviewed. Her effort: {Blank single:19197::"Good reproducible efforts.","It was hard to get consistent efforts and there is a question as to whether this reflects a maximal maneuver.","Poor effort, data can not be interpreted."} FVC: ***L FEV1: ***L, ***% predicted FEV1/FVC ratio: ***% Interpretation: {Blank single:19197::"Spirometry consistent with mild obstructive disease","Spirometry consistent with moderate obstructive disease","Spirometry consistent with severe obstructive disease","Spirometry consistent with possible restrictive disease","Spirometry consistent with mixed obstructive and restrictive disease","Spirometry uninterpretable due to technique","Spirometry consistent with normal pattern","No overt abnormalities noted given today's efforts"}.  Please see scanned spirometry results for details.  Skin Testing: {Blank single:19197::"Select foods","Environmental allergy panel","Environmental allergy  panel and select foods","Food allergy panel","None","Deferred due to recent antihistamines use"}. Positive test to: ***.  Negative test to: ***.  Results discussed with patient/family.   Medication List:  Current Outpatient Medications  Medication Sig Dispense Refill  . acetaminophen (TYLENOL) 500 MG tablet Take 1,000 mg by mouth 3 (three) times daily as needed (pain).    . ADDERALL XR 30 MG 24 hr capsule Take by mouth daily.    . ADZENYS XR-ODT 18.8 MG TBED Take 1 tablet by mouth every morning. (Patient not taking: Reported on 07/25/2020)    . albuterol (VENTOLIN HFA) 108 (90 Base) MCG/ACT inhaler INHALE 2 PUFFS INTO THE LUNGS EVERY 4 (FOUR) HOURS AS NEEDED (ASTHMA SYMPTOMS). 18 each 1  . ALPRAZolam (XANAX) 0.5 MG tablet Take 0.5 mg by mouth at bedtime. (Patient not taking: Reported on 07/25/2020)    . budesonide-formoterol (SYMBICORT) 160-4.5 MCG/ACT inhaler Inhale 2 puffs into the lungs in the morning and at bedtime. with spacer and rinse mouth afterwards. 1 each 5  . clonazePAM (KLONOPIN) 1 MG tablet TAKE 1 TABLET BY MOUTH 4 TIMES A DAY AS NEEDED    . Cyanocobalamin (VITAMIN B-12 PO) Take 1 tablet by mouth daily. (Patient not taking: Reported on 07/25/2020)    . EPINEPHrine 0.3 mg/0.3 mL IJ SOAJ injection     . famotidine (PEPCID) 20 MG tablet Take 1 tablet (20 mg total) by mouth 2 (two) times daily. 60 tablet 5  . fluticasone (FLONASE) 50 MCG/ACT nasal spray Place 1 spray into both nostrils 2 (two) times daily as needed (nasal congestion). 16 g 5  . FOLIC ACID PO Take 1 tablet by mouth daily. (Patient not taking: Reported on 07/25/2020)    . gabapentin (NEURONTIN) 300 MG capsule TAKE 2 CAPSULES BY MOUTH 2 3 TIMES A DAY    . Glucosamine HCl (GLUCOSAMINE PO) Take 1 tablet by mouth daily. (Patient not taking: Reported on 07/25/2020)    . lamoTRIgine (LAMICTAL) 150 MG tablet Take 50 mg by mouth daily.     Marland Kitchen levalbuterol (XOPENEX HFA) 45 MCG/ACT inhaler Inhale 2 puffs into the lungs every 4 (four)  hours as needed for wheezing or shortness of breath (coughing). 1 each 3  . levocetirizine (XYZAL) 5 MG tablet Take 1 tablet (5 mg total) by mouth every evening. 30 tablet 5  . lisdexamfetamine (VYVANSE) 40 MG capsule 60 mg (Patient not taking: Reported on 07/25/2020)    . MAGNESIUM PO Take 1 tablet by mouth daily. otc (Patient not taking: Reported on 07/25/2020)    . norethindrone-ethinyl estradiol (LOESTRIN FE) 1-20 MG-MCG tablet     . Olopatadine HCl (PATADAY) 0.2 % SOLN Place 1 drop into both eyes daily as needed. 2.5 mL 5  . omeprazole (PRILOSEC) 20 MG capsule Take 1 capsule (20 mg total) by mouth daily. 30 capsule 5  . orphenadrine (NORFLEX) 100 MG tablet TAKE 1 TABLET BY MOUTH TWICE A DAY IN THE MORNING AND EVENING AS NEEDED    . prazosin (MINIPRESS) 1 MG capsule TAKE 4 CAPSULES BY MOUTH AT BEDTIME    . sertraline (ZOLOFT) 50 MG tablet Take 150 mg by mouth at bedtime.    . traMADol (ULTRAM) 50 MG tablet Take 50 mg by mouth daily as needed (pain).     . traZODone (DESYREL) 50 MG tablet TAKE 1 TO 3 TABLETS BY MOUTH AT BEDTIME    . TURMERIC CURCUMIN PO Take 1 capsule by mouth daily. (Patient not taking: Reported on 07/25/2020)     No current facility-administered medications for this visit.   Allergies: No Known Allergies I reviewed her past medical  history, social history, family history, and environmental history and no significant changes have been reported from her previous visit.  Review of Systems  Constitutional: Negative for appetite change, chills, fever and unexpected weight change.  HENT: Negative for congestion and rhinorrhea.   Eyes: Negative for itching.  Respiratory: Positive for wheezing. Negative for cough, chest tightness and shortness of breath.   Skin: Negative for rash.  Allergic/Immunologic: Positive for environmental allergies.  Neurological: Negative for headaches.   Objective: There were no vitals taken for this visit. There is no height or weight on file to  calculate BMI. Physical Exam Vitals and nursing note reviewed.  Constitutional:      Appearance: Normal appearance. She is well-developed.  HENT:     Head: Normocephalic and atraumatic.     Right Ear: External ear normal.     Left Ear: External ear normal.     Nose: Nose normal.     Mouth/Throat:     Mouth: Mucous membranes are moist.     Pharynx: Oropharynx is clear.  Eyes:     Conjunctiva/sclera: Conjunctivae normal.  Cardiovascular:     Rate and Rhythm: Normal rate and regular rhythm.     Heart sounds: Normal heart sounds. No murmur heard.   Pulmonary:     Effort: Pulmonary effort is normal.     Breath sounds: Normal breath sounds. No wheezing, rhonchi or rales.  Musculoskeletal:     Cervical back: Neck supple.  Skin:    General: Skin is warm.     Findings: No rash.  Neurological:     Mental Status: She is alert and oriented to person, place, and time.  Psychiatric:        Behavior: Behavior normal.    Previous notes and tests were reviewed. The plan was reviewed with the patient/family, and all questions/concerned were addressed.  It was my pleasure to see Cassandra Baxter today and participate in her care. Please feel free to contact me with any questions or concerns.  Sincerely,  Rexene Alberts, DO Allergy & Immunology  [facility] office: [place] office: [phone]

## 2020-09-28 ENCOUNTER — Ambulatory Visit: Payer: BC Managed Care – PPO | Admitting: Allergy

## 2020-09-28 DIAGNOSIS — J309 Allergic rhinitis, unspecified: Secondary | ICD-10-CM

## 2020-09-28 DIAGNOSIS — H1010 Acute atopic conjunctivitis, unspecified eye: Secondary | ICD-10-CM

## 2020-09-28 DIAGNOSIS — J4540 Moderate persistent asthma, uncomplicated: Secondary | ICD-10-CM

## 2020-09-28 DIAGNOSIS — K219 Gastro-esophageal reflux disease without esophagitis: Secondary | ICD-10-CM

## 2020-10-03 ENCOUNTER — Telehealth: Payer: Self-pay | Admitting: Allergy and Immunology

## 2020-10-03 MED ORDER — AEROCHAMBER PLUS MISC: 2 refills | Status: DC

## 2020-10-03 MED ORDER — BUDESONIDE-FORMOTEROL FUMARATE 160-4.5 MCG/ACT IN AERO: 2.0000 | INHALATION_SPRAY | Freq: Two times a day (BID) | RESPIRATORY_TRACT | 0 refills | Status: DC

## 2020-10-03 NOTE — Telephone Encounter (Signed)
Sent in symbicort refill and rx for spacer for pt to [facility] [address]

## 2020-10-03 NOTE — Telephone Encounter (Signed)
Patient called requesting refill for symbicort and a spacer. Patient was told at last visit 07/25/20 a spacer would be sent in to pharmacy, patient was able to pick up all other medications except for that one. Patient is leaving for [place] Saturday & would really like to have her medication  [facility] contact number is: [phone]

## 2020-10-05 ENCOUNTER — Telehealth: Payer: Self-pay | Admitting: Allergy

## 2020-10-05 NOTE — Telephone Encounter (Signed)
Patient called today because she has not been able to pick up the Symbicort inhaler. She did pick up the spacer, but it had a mask attached to it. I told her it was detachable, but she said it would not come off. She is leaving for [place] this weekend and called because she did not want to be out of her medication. [facility] called [facility] to get an update on her Symbicort that was sent in 10/03/20. The pharmacist told me that she would not be able to pick it up until 10/17/20 due to her insurance not covering it. I informed them that she is leaving for [place] this weekend and the manager stated that he will call her insurance [employer] to get a manual override for the Symbicort as a vacation prescription. He also put in a revised order for the spacer without a mask. The pharmacist manager will call the patient after speaking with her insurance.

## 2020-10-05 NOTE — Telephone Encounter (Signed)
Patient states she went to [facility] and they did not have her Symbicort and Spacer. She is wondering if the refill could be sent in again because she is going out of town on Sunday. Patient also requested to have a Spacer without a mask.

## 2020-10-05 NOTE — Telephone Encounter (Signed)
Called and advised to the patient Cassandra Baxter's message. Patient verbalized understanding and was very grateful and appreciative.

## 2020-10-22 DIAGNOSIS — F4310 Post-traumatic stress disorder, unspecified: Secondary | ICD-10-CM | POA: Diagnosis not present

## 2020-10-24 ENCOUNTER — Other Ambulatory Visit: Payer: Self-pay

## 2020-10-24 ENCOUNTER — Other Ambulatory Visit: Payer: Self-pay | Admitting: Allergy

## 2020-10-24 MED ORDER — ALBUTEROL SULFATE HFA 108 (90 BASE) MCG/ACT IN AERS: 2.0000 | INHALATION_SPRAY | RESPIRATORY_TRACT | 0 refills | Status: DC | PRN

## 2020-10-24 NOTE — Telephone Encounter (Signed)
Albuterol has been sent in to [facility] on [address].

## 2020-10-24 NOTE — Telephone Encounter (Signed)
Last sent in 08/16/20 with 1 refill by Dr.Gallagher. Call the patient and left a message for her to call the office back to shedule an appointment for further refills. She was due back for an appointment in May of 2022.

## 2020-10-31 ENCOUNTER — Other Ambulatory Visit: Payer: Self-pay

## 2020-11-06 DIAGNOSIS — F4310 Post-traumatic stress disorder, unspecified: Secondary | ICD-10-CM | POA: Diagnosis not present

## 2020-11-12 ENCOUNTER — Other Ambulatory Visit (HOSPITAL_COMMUNITY): Payer: Self-pay | Admitting: Family Medicine

## 2020-11-12 DIAGNOSIS — R52 Pain, unspecified: Secondary | ICD-10-CM

## 2020-11-13 DIAGNOSIS — F4310 Post-traumatic stress disorder, unspecified: Secondary | ICD-10-CM | POA: Diagnosis not present

## 2020-11-15 ENCOUNTER — Ambulatory Visit (HOSPITAL_COMMUNITY): Admission: RE | Admit: 2020-11-15 | Payer: BC Managed Care – PPO | Source: Ambulatory Visit

## 2020-11-19 DIAGNOSIS — F4310 Post-traumatic stress disorder, unspecified: Secondary | ICD-10-CM | POA: Diagnosis not present

## 2020-11-20 DIAGNOSIS — M25561 Pain in right knee: Secondary | ICD-10-CM | POA: Diagnosis not present

## 2020-11-22 DIAGNOSIS — G894 Chronic pain syndrome: Secondary | ICD-10-CM | POA: Diagnosis not present

## 2020-11-22 DIAGNOSIS — M5136 Other intervertebral disc degeneration, lumbar region: Secondary | ICD-10-CM | POA: Diagnosis not present

## 2020-11-24 DIAGNOSIS — U071 COVID-19: Secondary | ICD-10-CM | POA: Diagnosis not present

## 2020-11-27 ENCOUNTER — Other Ambulatory Visit: Payer: Self-pay | Admitting: Allergy

## 2020-12-01 DIAGNOSIS — Z1389 Encounter for screening for other disorder: Secondary | ICD-10-CM | POA: Diagnosis not present

## 2021-01-08 ENCOUNTER — Other Ambulatory Visit: Payer: Self-pay | Admitting: Allergy

## 2021-01-22 DIAGNOSIS — S83281A Other tear of lateral meniscus, current injury, right knee, initial encounter: Secondary | ICD-10-CM | POA: Diagnosis not present

## 2021-01-23 ENCOUNTER — Other Ambulatory Visit: Payer: Self-pay | Admitting: Allergy

## 2021-01-25 ENCOUNTER — Other Ambulatory Visit: Payer: Self-pay | Admitting: Student

## 2021-01-25 DIAGNOSIS — M7631 Iliotibial band syndrome, right leg: Secondary | ICD-10-CM

## 2021-02-06 DIAGNOSIS — G8929 Other chronic pain: Secondary | ICD-10-CM | POA: Diagnosis not present

## 2021-02-06 DIAGNOSIS — M25561 Pain in right knee: Secondary | ICD-10-CM | POA: Diagnosis not present

## 2021-02-06 DIAGNOSIS — R2689 Other abnormalities of gait and mobility: Secondary | ICD-10-CM | POA: Diagnosis not present

## 2021-02-16 ENCOUNTER — Other Ambulatory Visit: Payer: Self-pay | Admitting: Allergy

## 2021-02-20 ENCOUNTER — Other Ambulatory Visit: Payer: Self-pay | Admitting: Allergy

## 2021-02-22 ENCOUNTER — Ambulatory Visit
Admission: RE | Admit: 2021-02-22 | Discharge: 2021-02-22 | Disposition: A | Discharge status: Home / Self Care | Payer: BC Managed Care – PPO | Source: Ambulatory Visit | Attending: Student | Admitting: Student

## 2021-02-22 DIAGNOSIS — Z6829 Body mass index (BMI) 29.0-29.9, adult: Secondary | ICD-10-CM | POA: Diagnosis not present

## 2021-02-22 DIAGNOSIS — M7631 Iliotibial band syndrome, right leg: Secondary | ICD-10-CM

## 2021-02-22 DIAGNOSIS — I10 Essential (primary) hypertension: Secondary | ICD-10-CM | POA: Diagnosis not present

## 2021-02-22 DIAGNOSIS — M5136 Other intervertebral disc degeneration, lumbar region: Secondary | ICD-10-CM | POA: Diagnosis not present

## 2021-02-22 DIAGNOSIS — M25561 Pain in right knee: Secondary | ICD-10-CM | POA: Diagnosis not present

## 2021-02-22 DIAGNOSIS — Z79899 Other long term (current) drug therapy: Secondary | ICD-10-CM | POA: Diagnosis not present

## 2021-02-22 DIAGNOSIS — G894 Chronic pain syndrome: Secondary | ICD-10-CM | POA: Diagnosis not present

## 2021-03-04 DIAGNOSIS — R2689 Other abnormalities of gait and mobility: Secondary | ICD-10-CM | POA: Diagnosis not present

## 2021-03-04 DIAGNOSIS — G8929 Other chronic pain: Secondary | ICD-10-CM | POA: Diagnosis not present

## 2021-03-04 DIAGNOSIS — M25561 Pain in right knee: Secondary | ICD-10-CM | POA: Diagnosis not present

## 2021-03-08 ENCOUNTER — Other Ambulatory Visit: Payer: Self-pay | Admitting: Allergy

## 2021-03-08 DIAGNOSIS — F4310 Post-traumatic stress disorder, unspecified: Secondary | ICD-10-CM | POA: Diagnosis not present

## 2021-03-08 DIAGNOSIS — F900 Attention-deficit hyperactivity disorder, predominantly inattentive type: Secondary | ICD-10-CM | POA: Diagnosis not present

## 2021-03-08 DIAGNOSIS — F3341 Major depressive disorder, recurrent, in partial remission: Secondary | ICD-10-CM | POA: Diagnosis not present

## 2021-03-12 DIAGNOSIS — S83281D Other tear of lateral meniscus, current injury, right knee, subsequent encounter: Secondary | ICD-10-CM | POA: Diagnosis not present

## 2021-03-12 DIAGNOSIS — M25561 Pain in right knee: Secondary | ICD-10-CM | POA: Diagnosis not present

## 2021-03-18 DIAGNOSIS — G8929 Other chronic pain: Secondary | ICD-10-CM | POA: Diagnosis not present

## 2021-03-18 DIAGNOSIS — M25561 Pain in right knee: Secondary | ICD-10-CM | POA: Diagnosis not present

## 2021-03-18 DIAGNOSIS — R2689 Other abnormalities of gait and mobility: Secondary | ICD-10-CM | POA: Diagnosis not present

## 2021-03-19 DIAGNOSIS — M25561 Pain in right knee: Secondary | ICD-10-CM | POA: Diagnosis not present

## 2021-03-27 ENCOUNTER — Other Ambulatory Visit: Payer: Self-pay | Admitting: Allergy

## 2021-03-29 DIAGNOSIS — R2689 Other abnormalities of gait and mobility: Secondary | ICD-10-CM | POA: Diagnosis not present

## 2021-03-29 DIAGNOSIS — G8929 Other chronic pain: Secondary | ICD-10-CM | POA: Diagnosis not present

## 2021-03-29 DIAGNOSIS — M25561 Pain in right knee: Secondary | ICD-10-CM | POA: Diagnosis not present

## 2021-04-05 DIAGNOSIS — G8929 Other chronic pain: Secondary | ICD-10-CM | POA: Diagnosis not present

## 2021-04-05 DIAGNOSIS — M25561 Pain in right knee: Secondary | ICD-10-CM | POA: Diagnosis not present

## 2021-04-05 DIAGNOSIS — R2689 Other abnormalities of gait and mobility: Secondary | ICD-10-CM | POA: Diagnosis not present

## 2021-04-09 ENCOUNTER — Telehealth: Payer: Self-pay

## 2021-04-09 MED ORDER — SPACER/AERO-HOLDING CHAMBERS DEVI: 1.0000 | Freq: Two times a day (BID) | 0 refills | Status: DC | PRN

## 2021-04-09 MED ORDER — FLUTICASONE PROPIONATE 50 MCG/ACT NA SUSP: NASAL | 0 refills | Status: DC

## 2021-04-09 MED ORDER — LEVOCETIRIZINE DIHYDROCHLORIDE 5 MG PO TABS
5.0000 mg | ORAL_TABLET | Freq: Every evening | ORAL | 0 refills | Status: DC
Start: 2021-04-09 — End: 2021-05-02

## 2021-04-09 MED ORDER — BUDESONIDE-FORMOTEROL FUMARATE 160-4.5 MCG/ACT IN AERO: 2.0000 | INHALATION_SPRAY | Freq: Two times a day (BID) | RESPIRATORY_TRACT | 0 refills | Status: DC

## 2021-04-09 MED ORDER — AEROCHAMBER PLUS MISC: 2 refills | Status: AC

## 2021-04-09 NOTE — Telephone Encounter (Signed)
Patient called to schedule a follow up visit. She is scheduled for 05/02/2021 with Chrissie.   She is requesting a refill on Symbicort, Levocetrizine, Flonase and a Spacer.   [place]

## 2021-04-09 NOTE — Telephone Encounter (Signed)
Spoke to patient, patient informed me that she didn't realize she had not been seen in a while. She stated that her grandmother passed and step daughter tried to commit suicide and its been a rough year. Patient did make an appointment for 05/02/2021. I informed patient that I will send in one month refill however patient will need to keep appointment on 05/02/2021. Patient has been made aware and stated she will not miss her appointment and that she is very grateful for another courtesy refill.

## 2021-04-09 NOTE — Addendum Note (Signed)
Addended by: Herbie Drape on: 04/09/2021 12:44 PM   Modules accepted: Orders

## 2021-04-19 ENCOUNTER — Other Ambulatory Visit: Payer: Self-pay | Admitting: Allergy

## 2021-04-23 DIAGNOSIS — M25561 Pain in right knee: Secondary | ICD-10-CM | POA: Diagnosis not present

## 2021-05-01 ENCOUNTER — Other Ambulatory Visit: Payer: Self-pay | Admitting: Allergy

## 2021-05-01 NOTE — Patient Instructions (Addendum)
Asthma: Daily controller medication(s): Continue Symbicort 148mcg 2 puffs twice a day with spacer and rinse mouth afterwards. Make sure you use your Symbicort daily like you recently have been May use levpalbuterol rescue inhaler 2 puffs every 4 to 6 hours as needed for shortness of breath, chest tightness, coughing, and wheezing. May use levoalbuterol rescue inhaler 2 puffs 5 to 15 minutes prior to strenuous physical activities. Monitor frequency of use.  The levoalbuterol rescue inhaler should not make you have heart palpitations.  Asthma control goals:  Full participation in all desired activities (may need albuterol before activity) Albuterol use two times or less a week on average (not counting use with activity) Cough interfering with sleep two times or less a month Oral steroids no more than once a year No hospitalizations  Allergic rhinoconjunctivitis: Past skin testing was positive to grass, weeds, ragweed, tree, mold, dust mites, cat, dog and horse. Continue environmental control measures.  May use over the counter antihistamines such as Xyzal (levocetirizine) daily as needed. May use Flonase (fluticasone) nasal spray 1 spray per nostril twice a day as needed for nasal congestion.  Nasal saline spray (i.e., Simply Saline) or nasal saline lavage (i.e., NeilMed) is recommended as needed and prior to medicated nasal sprays. Consider allergy injections in the future once breathing under better control.    Reflux: Continue lifestyle and dietary modifications. Continue famotidine 20mg  1-2 times a day. Continue omeprazole 20mg  daily - nothing to eat or drink for 30 minutes afterwards.   Follow up in 3 months or sooner if needed.  Reducing Pollen Exposure Pollen seasons: trees (spring), grass (summer) and ragweed/weeds (fall). Keep windows closed in your home and car to lower pollen exposure.  Install air conditioning in the bedroom and throughout the house if possible.  Avoid going  out in dry windy days - especially early morning. Pollen counts are highest between 5 - 10 AM and on dry, hot and windy days.  Save outside activities for late afternoon or after a heavy rain, when pollen levels are lower.  Avoid mowing of grass if you have grass pollen allergy. Be aware that pollen can also be transported indoors on people and pets.  Dry your clothes in an automatic dryer rather than hanging them outside where they might collect pollen.  Rinse hair and eyes before bedtime. Mold Control Mold and fungi can grow on a variety of surfaces provided certain temperature and moisture conditions exist.  Outdoor molds grow on plants, decaying vegetation and soil. The major outdoor mold, Alternaria and Cladosporium, are found in very high numbers during hot and dry conditions. Generally, a late summer - fall peak is seen for common outdoor fungal spores. Rain will temporarily lower outdoor mold spore count, but counts rise rapidly when the rainy period ends. The most important indoor molds are Aspergillus and Penicillium. Dark, humid and poorly ventilated basements are ideal sites for mold growth. The next most common sites of mold growth are the bathroom and the kitchen. Outdoor (Seasonal) Mold Control Use air conditioning and keep windows closed. Avoid exposure to decaying vegetation. Avoid leaf raking. Avoid grain handling. Consider wearing a face mask if working in moldy areas.  Indoor (Perennial) Mold Control  Maintain humidity below 50%. Get rid of mold growth on hard surfaces with water, detergent and, if necessary, 5% bleach (do not mix with other cleaners). Then dry the area completely. If mold covers an area more than 10 square feet, consider hiring an Patent examiner. For clothing,  washing with soap and water is best. If moldy items cannot be cleaned and dried, throw them away. Remove sources e.g. contaminated carpets. Repair and seal leaking roofs or pipes.  Using dehumidifiers in damp basements may be helpful, but empty the water and clean units regularly to prevent mildew from forming. All rooms, especially basements, bathrooms and kitchens, require ventilation and cleaning to deter mold and mildew growth. Avoid carpeting on concrete or damp floors, and storing items in damp areas. Control of House Dust Mite Allergen Dust mite allergens are a common trigger of allergy and asthma symptoms. While they can be found throughout the house, these microscopic creatures thrive in warm, humid environments such as bedding, upholstered furniture and carpeting. Because so much time is spent in the bedroom, it is essential to reduce mite levels there.  Encase pillows, mattresses, and box springs in special allergen-proof fabric covers or airtight, zippered plastic covers.  Bedding should be washed weekly in hot water (130 F) and dried in a hot dryer. Allergen-proof covers are available for comforters and pillows that cant be regularly washed.  Wash the allergy-proof covers every few months. Minimize clutter in the bedroom. Keep pets out of the bedroom.  Keep humidity less than 50% by using a dehumidifier or air conditioning. You can buy a humidity measuring device called a hygrometer to monitor this.  If possible, replace carpets with hardwood, linoleum, or washable area rugs. If that's not possible, vacuum frequently with a vacuum that has a HEPA filter. Remove all upholstered furniture and non-washable window drapes from the bedroom. Remove all non-washable stuffed toys from the bedroom.  Wash stuffed toys weekly. Pet Allergen Avoidance: Contrary to popular opinion, there are no hypoallergenic breeds of dogs or cats. That is because people are not allergic to an animals hair, but to an allergen found in the animal's saliva, dander (dead skin flakes) or urine. Pet allergy symptoms typically occur within minutes. For some people, symptoms can build up and become  most severe 8 to 12 hours after contact with the animal. People with severe allergies can experience reactions in public places if dander has been transported on the pet owners clothing. Keeping an animal outdoors is only a partial solution, since homes with pets in the yard still have higher concentrations of animal allergens. Before getting a pet, ask your allergist to determine if you are allergic to animals. If your pet is already considered part of your family, try to minimize contact and keep the pet out of the bedroom and other rooms where you spend a great deal of time. As with dust mites, vacuum carpets often or replace carpet with a hardwood floor, tile or linoleum. High-efficiency particulate air (HEPA) cleaners can reduce allergen levels over time. While dander and saliva are the source of cat and dog allergens, urine is the source of allergens from rabbits, hamsters, mice and [place] pigs; so ask a non-allergic family member to clean the animals cage. If you have a pet allergy, talk to your allergist about the potential for allergy immunotherapy (allergy shots). This strategy can often provide long-term relief.

## 2021-05-02 ENCOUNTER — Encounter: Payer: Self-pay | Admitting: Family

## 2021-05-02 ENCOUNTER — Other Ambulatory Visit: Payer: Self-pay | Admitting: Family

## 2021-05-02 ENCOUNTER — Other Ambulatory Visit: Payer: Self-pay

## 2021-05-02 ENCOUNTER — Telehealth: Payer: Self-pay | Admitting: *Deleted

## 2021-05-02 ENCOUNTER — Ambulatory Visit (INDEPENDENT_AMBULATORY_CARE_PROVIDER_SITE_OTHER): Payer: BC Managed Care – PPO | Admitting: Family

## 2021-05-02 VITALS — BP 110/80 | HR 95 | Temp 98.3°F | Resp 16 | Ht 70.0 in | Wt 205.2 lb

## 2021-05-02 DIAGNOSIS — J4540 Moderate persistent asthma, uncomplicated: Secondary | ICD-10-CM | POA: Diagnosis not present

## 2021-05-02 DIAGNOSIS — K219 Gastro-esophageal reflux disease without esophagitis: Secondary | ICD-10-CM | POA: Diagnosis not present

## 2021-05-02 DIAGNOSIS — H1010 Acute atopic conjunctivitis, unspecified eye: Secondary | ICD-10-CM

## 2021-05-02 DIAGNOSIS — H1013 Acute atopic conjunctivitis, bilateral: Secondary | ICD-10-CM

## 2021-05-02 DIAGNOSIS — J302 Other seasonal allergic rhinitis: Secondary | ICD-10-CM

## 2021-05-02 MED ORDER — OMEPRAZOLE 20 MG PO CPDR
20.0000 mg | DELAYED_RELEASE_CAPSULE | Freq: Every day | ORAL | 5 refills | Status: DC
Start: 2021-05-02 — End: 2022-05-09

## 2021-05-02 MED ORDER — LEVALBUTEROL TARTRATE 45 MCG/ACT IN AERO: 2.0000 | INHALATION_SPRAY | RESPIRATORY_TRACT | 1 refills | Status: DC | PRN

## 2021-05-02 MED ORDER — FAMOTIDINE 20 MG PO TABS: ORAL_TABLET | ORAL | 5 refills | Status: DC

## 2021-05-02 MED ORDER — LEVOCETIRIZINE DIHYDROCHLORIDE 5 MG PO TABS: 5.0000 mg | ORAL_TABLET | Freq: Every evening | ORAL | 5 refills | Status: DC

## 2021-05-02 MED ORDER — FLUTICASONE PROPIONATE 50 MCG/ACT NA SUSP: NASAL | 0 refills | Status: DC

## 2021-05-02 MED ORDER — SPACER/AERO-HOLDING CHAMBERS DEVI: 0 refills | Status: AC

## 2021-05-02 MED ORDER — BUDESONIDE-FORMOTEROL FUMARATE 160-4.5 MCG/ACT IN AERO: 2.0000 | INHALATION_SPRAY | Freq: Two times a day (BID) | RESPIRATORY_TRACT | 5 refills | Status: DC

## 2021-05-02 NOTE — Telephone Encounter (Signed)
Please call and see what type of albuterol she is using right now ex: Ventolin, ProAir.   Thank you! Cassandra Charon, FNP
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
## 2021-05-02 NOTE — Telephone Encounter (Signed)
PA has been approved for Levalbuterol. PA has been faxed to patients pharmacy, labeled, and placed in bulk scanning. Called patient and advised. Patient verbalized understanding.

## 2021-05-02 NOTE — Telephone Encounter (Signed)
Will initiate PA for levalbuterol and work on approval. Musician and spoke with patient and she has tried and failed Albuterol, Proventil, and Ventolin due to heart racing.

## 2021-05-02 NOTE — Telephone Encounter (Signed)
PA has been submitted through CoverMyMeds for Levalbuterol and is currently pending approval/denial.  

## 2021-05-02 NOTE — Telephone Encounter (Signed)
Thank you :)

## 2021-05-23 DIAGNOSIS — G894 Chronic pain syndrome: Secondary | ICD-10-CM | POA: Diagnosis not present

## 2021-05-23 DIAGNOSIS — M5136 Other intervertebral disc degeneration, lumbar region: Secondary | ICD-10-CM | POA: Diagnosis not present

## 2021-07-23 DIAGNOSIS — M25561 Pain in right knee: Secondary | ICD-10-CM | POA: Diagnosis not present

## 2021-07-31 DIAGNOSIS — M948X6 Other specified disorders of cartilage, lower leg: Secondary | ICD-10-CM | POA: Diagnosis not present

## 2021-07-31 DIAGNOSIS — X58XXXA Exposure to other specified factors, initial encounter: Secondary | ICD-10-CM | POA: Diagnosis not present

## 2021-07-31 DIAGNOSIS — S83271A Complex tear of lateral meniscus, current injury, right knee, initial encounter: Secondary | ICD-10-CM | POA: Diagnosis not present

## 2021-07-31 DIAGNOSIS — S83281A Other tear of lateral meniscus, current injury, right knee, initial encounter: Secondary | ICD-10-CM | POA: Diagnosis not present

## 2021-07-31 DIAGNOSIS — Y999 Unspecified external cause status: Secondary | ICD-10-CM | POA: Diagnosis not present

## 2021-07-31 NOTE — Patient Instructions (Incomplete)
Asthma: ?Daily controller medication(s): Continue Symbicort 165mg 2 puffs twice a day with spacer and rinse mouth afterwards. ?Make sure you use your Symbicort daily like you recently have been ?May use levpalbuterol rescue inhaler 2 puffs every 4 to 6 hours as needed for shortness of breath, chest tightness, coughing, and wheezing. May use levoalbuterol rescue inhaler 2 puffs 5 to 15 minutes prior to strenuous physical activities. Monitor frequency of use.  ? ?Asthma control goals:  ?Full participation in all desired activities (may need albuterol before activity) ?Albuterol use two times or less a week on average (not counting use with activity) ?Cough interfering with sleep two times or less a month ?Oral steroids no more than once a year ?No hospitalizations ? ?Allergic rhinoconjunctivitis: ?Past skin testing was positive to grass, weeds, ragweed, tree, mold, dust mites, cat, dog and horse. ?Continue environmental control measures.  ?May use over the counter antihistamines such as Xyzal (levocetirizine) daily as needed. ?May use Flonase (fluticasone) nasal spray 1 spray per nostril twice a day as needed for nasal congestion.  ?Nasal saline spray (i.e., Simply Saline) or nasal saline lavage (i.e., NeilMed) is recommended as needed and prior to medicated nasal sprays. ?Consider allergy injections in the future once breathing under better control. ?  ? ?Reflux: ?Continue lifestyle and dietary modifications. ?Continue famotidine '20mg'$  1-2 times a day. ?Continue omeprazole '20mg'$  daily - nothing to eat or drink for 30 minutes afterwards.  ? ?Follow up in  months or sooner if needed. ? ?Reducing Pollen Exposure ?Pollen seasons: trees (spring), grass (summer) and ragweed/weeds (fall). ?Keep windows closed in your home and car to lower pollen exposure.  ?Install air conditioning in the bedroom and throughout the house if possible.  ?Avoid going out in dry windy days - especially early morning. ?Pollen counts are highest  between 5 - 10 AM and on dry, hot and windy days.  ?Save outside activities for late afternoon or after a heavy rain, when pollen levels are lower.  ?Avoid mowing of grass if you have grass pollen allergy. ?Be aware that pollen can also be transported indoors on people and pets.  ?Dry your clothes in an automatic dryer rather than hanging them outside where they might collect pollen.  ?Rinse hair and eyes before bedtime. ?Mold Control ?Mold and fungi can grow on a variety of surfaces provided certain temperature and moisture conditions exist.  ?Outdoor molds grow on plants, decaying vegetation and soil. The major outdoor mold, Alternaria and Cladosporium, are found in very high numbers during hot and dry conditions. Generally, a late summer - fall peak is seen for common outdoor fungal spores. Rain will temporarily lower outdoor mold spore count, but counts rise rapidly when the rainy period ends. ?The most important indoor molds are Aspergillus and Penicillium. Dark, humid and poorly ventilated basements are ideal sites for mold growth. The next most common sites of mold growth are the bathroom and the kitchen. ?Outdoor (Seasonal) Mold Control ?Use air conditioning and keep windows closed. ?Avoid exposure to decaying vegetation. ?Avoid leaf raking. ?Avoid grain handling. ?Consider wearing a face mask if working in moldy areas.  ?Indoor (Perennial) Mold Control  ?Maintain humidity below 50%. ?Get rid of mold growth on hard surfaces with water, detergent and, if necessary, 5% bleach (do not mix with other cleaners). Then dry the area completely. If mold covers an area more than 10 square feet, consider hiring an indoor environmental professional. ?For clothing, washing with soap and water is best. If moldy items cannot  be cleaned and dried, throw them away. ?Remove sources e.g. contaminated carpets. ?Repair and seal leaking roofs or pipes. Using dehumidifiers in damp basements may be helpful, but empty the water and  clean units regularly to prevent mildew from forming. All rooms, especially basements, bathrooms and kitchens, require ventilation and cleaning to deter mold and mildew growth. Avoid carpeting on concrete or damp floors, and storing items in damp areas. ?Control of House Dust Mite Allergen ?Dust mite allergens are a common trigger of allergy and asthma symptoms. While they can be found throughout the house, these microscopic creatures thrive in warm, humid environments such as bedding, upholstered furniture and carpeting. ?Because so much time is spent in the bedroom, it is essential to reduce mite levels there.  ?Encase pillows, mattresses, and box springs in special allergen-proof fabric covers or airtight, zippered plastic covers.  ?Bedding should be washed weekly in hot water (130? F) and dried in a hot dryer. Allergen-proof covers are available for comforters and pillows that can?t be regularly washed.  ?Wash the allergy-proof covers every few months. Minimize clutter in the bedroom. Keep pets out of the bedroom.  ?Keep humidity less than 50% by using a dehumidifier or air conditioning. You can buy a humidity measuring device called a hygrometer to monitor this.  ?If possible, replace carpets with hardwood, linoleum, or washable area rugs. If that's not possible, vacuum frequently with a vacuum that has a HEPA filter. ?Remove all upholstered furniture and non-washable window drapes from the bedroom. ?Remove all non-washable stuffed toys from the bedroom.  Wash stuffed toys weekly. ?Pet Allergen Avoidance: ?Contrary to popular opinion, there are no ?hypoallergenic? breeds of dogs or cats. That is because people are not allergic to an animal?s hair, but to an allergen found in the animal's saliva, dander (dead skin flakes) or urine. Pet allergy symptoms typically occur within minutes. For some people, symptoms can build up and become most severe 8 to 12 hours after contact with the animal. People with severe  allergies can experience reactions in public places if dander has been transported on the pet owners? clothing. ?Keeping an animal outdoors is only a partial solution, since homes with pets in the yard still have higher concentrations of animal allergens. ?Before getting a pet, ask your allergist to determine if you are allergic to animals. If your pet is already considered part of your family, try to minimize contact and keep the pet out of the bedroom and other rooms where you spend a great deal of time. ?As with dust mites, vacuum carpets often or replace carpet with a hardwood floor, tile or linoleum. ?High-efficiency particulate air (HEPA) cleaners can reduce allergen levels over time. ?While dander and saliva are the source of cat and dog allergens, urine is the source of allergens from rabbits, hamsters, mice and [place] pigs; so ask a non-allergic family member to clean the animal?s cage. ?If you have a pet allergy, talk to your allergist about the potential for allergy immunotherapy (allergy shots). This strategy can often provide long-term relief. ? ? ? ? ?

## 2021-08-05 ENCOUNTER — Ambulatory Visit: Payer: BC Managed Care – PPO | Admitting: Family

## 2021-08-05 DIAGNOSIS — J309 Allergic rhinitis, unspecified: Secondary | ICD-10-CM

## 2021-08-30 DIAGNOSIS — M25561 Pain in right knee: Secondary | ICD-10-CM | POA: Diagnosis not present

## 2021-08-30 DIAGNOSIS — M5459 Other low back pain: Secondary | ICD-10-CM | POA: Diagnosis not present

## 2021-08-30 DIAGNOSIS — M25661 Stiffness of right knee, not elsewhere classified: Secondary | ICD-10-CM | POA: Diagnosis not present

## 2021-09-02 DIAGNOSIS — G894 Chronic pain syndrome: Secondary | ICD-10-CM | POA: Diagnosis not present

## 2021-09-02 DIAGNOSIS — Z79899 Other long term (current) drug therapy: Secondary | ICD-10-CM | POA: Diagnosis not present

## 2021-09-10 DIAGNOSIS — M25661 Stiffness of right knee, not elsewhere classified: Secondary | ICD-10-CM | POA: Diagnosis not present

## 2021-09-10 DIAGNOSIS — M25561 Pain in right knee: Secondary | ICD-10-CM | POA: Diagnosis not present

## 2021-09-10 DIAGNOSIS — M5459 Other low back pain: Secondary | ICD-10-CM | POA: Diagnosis not present

## 2021-09-24 DIAGNOSIS — F4310 Post-traumatic stress disorder, unspecified: Secondary | ICD-10-CM | POA: Diagnosis not present

## 2021-09-24 DIAGNOSIS — F900 Attention-deficit hyperactivity disorder, predominantly inattentive type: Secondary | ICD-10-CM | POA: Diagnosis not present

## 2021-09-24 DIAGNOSIS — F3342 Major depressive disorder, recurrent, in full remission: Secondary | ICD-10-CM | POA: Diagnosis not present

## 2021-09-25 DIAGNOSIS — M25561 Pain in right knee: Secondary | ICD-10-CM | POA: Diagnosis not present

## 2021-09-25 DIAGNOSIS — M25661 Stiffness of right knee, not elsewhere classified: Secondary | ICD-10-CM | POA: Diagnosis not present

## 2021-09-25 DIAGNOSIS — M5459 Other low back pain: Secondary | ICD-10-CM | POA: Diagnosis not present

## 2021-09-30 ENCOUNTER — Other Ambulatory Visit: Payer: Self-pay | Admitting: Family

## 2021-10-01 DIAGNOSIS — M5459 Other low back pain: Secondary | ICD-10-CM | POA: Diagnosis not present

## 2021-10-01 DIAGNOSIS — M25661 Stiffness of right knee, not elsewhere classified: Secondary | ICD-10-CM | POA: Diagnosis not present

## 2021-10-01 DIAGNOSIS — M25561 Pain in right knee: Secondary | ICD-10-CM | POA: Diagnosis not present

## 2021-10-02 NOTE — Telephone Encounter (Signed)
Courtesy refill sent of Xopenex. Pt needs office visit for additional refills.

## 2021-10-22 DIAGNOSIS — M25661 Stiffness of right knee, not elsewhere classified: Secondary | ICD-10-CM | POA: Diagnosis not present

## 2021-10-22 DIAGNOSIS — M25561 Pain in right knee: Secondary | ICD-10-CM | POA: Diagnosis not present

## 2021-10-22 DIAGNOSIS — M5459 Other low back pain: Secondary | ICD-10-CM | POA: Diagnosis not present

## 2021-11-05 DIAGNOSIS — M5459 Other low back pain: Secondary | ICD-10-CM | POA: Diagnosis not present

## 2021-11-05 DIAGNOSIS — M25561 Pain in right knee: Secondary | ICD-10-CM | POA: Diagnosis not present

## 2021-11-05 DIAGNOSIS — M25661 Stiffness of right knee, not elsewhere classified: Secondary | ICD-10-CM | POA: Diagnosis not present

## 2021-11-15 ENCOUNTER — Other Ambulatory Visit: Payer: Self-pay | Admitting: Family

## 2021-11-17 ENCOUNTER — Other Ambulatory Visit: Payer: Self-pay | Admitting: Family

## 2021-11-17 NOTE — Telephone Encounter (Signed)
Ok to send 90 day supply with 1 refill

## 2021-11-18 ENCOUNTER — Other Ambulatory Visit: Payer: Self-pay

## 2021-11-19 ENCOUNTER — Other Ambulatory Visit: Payer: Self-pay | Admitting: Family

## 2021-11-26 DIAGNOSIS — M25661 Stiffness of right knee, not elsewhere classified: Secondary | ICD-10-CM | POA: Diagnosis not present

## 2021-11-26 DIAGNOSIS — M25561 Pain in right knee: Secondary | ICD-10-CM | POA: Diagnosis not present

## 2021-11-26 DIAGNOSIS — M5459 Other low back pain: Secondary | ICD-10-CM | POA: Diagnosis not present

## 2021-11-29 ENCOUNTER — Other Ambulatory Visit: Payer: Self-pay | Admitting: Family

## 2021-11-30 NOTE — Telephone Encounter (Signed)
Ok to send courtesy refill if one has not been sent already. She needs to schedule a follow up appointment

## 2021-12-02 ENCOUNTER — Telehealth: Payer: Self-pay | Admitting: Family

## 2021-12-02 DIAGNOSIS — M5136 Other intervertebral disc degeneration, lumbar region: Secondary | ICD-10-CM | POA: Diagnosis not present

## 2021-12-02 DIAGNOSIS — G894 Chronic pain syndrome: Secondary | ICD-10-CM | POA: Diagnosis not present

## 2021-12-02 MED ORDER — BUDESONIDE-FORMOTEROL FUMARATE 160-4.5 MCG/ACT IN AERO: 2.0000 | INHALATION_SPRAY | Freq: Two times a day (BID) | RESPIRATORY_TRACT | 5 refills | Status: DC

## 2021-12-02 NOTE — Telephone Encounter (Signed)
Pt called and is needing an Rx refill for their Symbicort.

## 2021-12-02 NOTE — Telephone Encounter (Signed)
Rx sent to pharmacy as requested.

## 2022-01-30 DIAGNOSIS — Z1231 Encounter for screening mammogram for malignant neoplasm of breast: Secondary | ICD-10-CM | POA: Diagnosis not present

## 2022-01-30 DIAGNOSIS — Z13220 Encounter for screening for lipoid disorders: Secondary | ICD-10-CM | POA: Diagnosis not present

## 2022-01-30 DIAGNOSIS — Z124 Encounter for screening for malignant neoplasm of cervix: Secondary | ICD-10-CM | POA: Diagnosis not present

## 2022-01-30 DIAGNOSIS — K219 Gastro-esophageal reflux disease without esophagitis: Secondary | ICD-10-CM | POA: Diagnosis not present

## 2022-01-30 DIAGNOSIS — Z23 Encounter for immunization: Secondary | ICD-10-CM | POA: Diagnosis not present

## 2022-01-30 DIAGNOSIS — F324 Major depressive disorder, single episode, in partial remission: Secondary | ICD-10-CM | POA: Diagnosis not present

## 2022-01-30 DIAGNOSIS — Z0000 Encounter for general adult medical examination without abnormal findings: Secondary | ICD-10-CM | POA: Diagnosis not present

## 2022-01-30 DIAGNOSIS — K580 Irritable bowel syndrome with diarrhea: Secondary | ICD-10-CM | POA: Diagnosis not present

## 2022-02-17 DIAGNOSIS — Z79899 Other long term (current) drug therapy: Secondary | ICD-10-CM | POA: Diagnosis not present

## 2022-02-17 DIAGNOSIS — G894 Chronic pain syndrome: Secondary | ICD-10-CM | POA: Diagnosis not present

## 2022-02-17 DIAGNOSIS — M5136 Other intervertebral disc degeneration, lumbar region: Secondary | ICD-10-CM | POA: Diagnosis not present

## 2022-02-28 DIAGNOSIS — Z1231 Encounter for screening mammogram for malignant neoplasm of breast: Secondary | ICD-10-CM | POA: Diagnosis not present

## 2022-03-18 DIAGNOSIS — F3342 Major depressive disorder, recurrent, in full remission: Secondary | ICD-10-CM | POA: Diagnosis not present

## 2022-03-18 DIAGNOSIS — F4310 Post-traumatic stress disorder, unspecified: Secondary | ICD-10-CM | POA: Diagnosis not present

## 2022-03-18 DIAGNOSIS — F900 Attention-deficit hyperactivity disorder, predominantly inattentive type: Secondary | ICD-10-CM | POA: Diagnosis not present

## 2022-04-02 DIAGNOSIS — Z6829 Body mass index (BMI) 29.0-29.9, adult: Secondary | ICD-10-CM | POA: Diagnosis not present

## 2022-04-02 DIAGNOSIS — Z124 Encounter for screening for malignant neoplasm of cervix: Secondary | ICD-10-CM | POA: Diagnosis not present

## 2022-04-02 DIAGNOSIS — N943 Premenstrual tension syndrome: Secondary | ICD-10-CM | POA: Diagnosis not present

## 2022-04-02 DIAGNOSIS — B3731 Acute candidiasis of vulva and vagina: Secondary | ICD-10-CM | POA: Diagnosis not present

## 2022-04-02 DIAGNOSIS — N898 Other specified noninflammatory disorders of vagina: Secondary | ICD-10-CM | POA: Diagnosis not present

## 2022-04-02 DIAGNOSIS — N925 Other specified irregular menstruation: Secondary | ICD-10-CM | POA: Diagnosis not present

## 2022-04-02 DIAGNOSIS — Z01419 Encounter for gynecological examination (general) (routine) without abnormal findings: Secondary | ICD-10-CM | POA: Diagnosis not present

## 2022-04-07 ENCOUNTER — Encounter: Payer: Self-pay | Admitting: Nurse Practitioner

## 2022-05-09 ENCOUNTER — Encounter: Payer: Self-pay | Admitting: Nurse Practitioner

## 2022-05-09 ENCOUNTER — Other Ambulatory Visit: Payer: BC Managed Care – PPO

## 2022-05-09 ENCOUNTER — Ambulatory Visit (INDEPENDENT_AMBULATORY_CARE_PROVIDER_SITE_OTHER): Payer: BC Managed Care – PPO | Admitting: Nurse Practitioner

## 2022-05-09 VITALS — BP 160/90 | HR 77 | Ht 68.0 in | Wt 208.0 lb

## 2022-05-09 DIAGNOSIS — R140 Abdominal distension (gaseous): Secondary | ICD-10-CM

## 2022-05-09 DIAGNOSIS — K529 Noninfective gastroenteritis and colitis, unspecified: Secondary | ICD-10-CM

## 2022-05-09 MED ORDER — NA SULFATE-K SULFATE-MG SULF 17.5-3.13-1.6 GM/177ML PO SOLN: 1.0000 | ORAL | 0 refills | Status: AC

## 2022-05-09 NOTE — Patient Instructions (Addendum)
We have sent the following medications to your pharmacy for you to pick up at your convenience: [facility] provider has requested that you go to the basement level for lab work before leaving today. Press "B" on the elevator. The lab is located at the first door on the left as you exit the elevator.  You have been scheduled for a colonoscopy. Please follow written instructions given to you at your visit today.  Please pick up your prep supplies at the pharmacy within the next 1-3 days. If you use inhalers (even only as needed), please bring them with you on the day of your procedure.   Due to recent changes in healthcare laws, you may see the results of your imaging and laboratory studies on MyChart before your provider has had a chance to review them.  We understand that in some cases there may be results that are confusing or concerning to you. Not all laboratory results come back in the same time frame and the provider may be waiting for multiple results in order to interpret others.  Please give [place] 48 hours in order for your provider to thoroughly review all the results before contacting the office for clarification of your results.   Thank you for choosing me and [facility].  Tye Savoy- NP

## 2022-05-09 NOTE — Progress Notes (Signed)
Assessment    Patient profile:  Cassandra Baxter is a 42 y.o. year old female , new to the practice with a past medical history of PTSD ( childhood trauma), ADHD, anxiety, [facility] of colon polyps.  See PMH / [place] for additional history. Referred by PCP for IBS  # Chronic abdominal bloating and loose,  often urgent stools despite dietary changes ( underwent food allergy testing) . No associated weight loss. DDx:  medication related? Anxiety related? Need to exclude organic disease. Consider celiac disease. Consider SIBO. Microscopic colitis seems unlikely given age. IBD unlikely.  Tells me thyroid studies were normal. Drinks diet sodas sometimes. Didn't realize artifical sweeteners can cause bloating and diarrhea in some patients    Plan:    Schedule for a diagnotic colonoscopy. The risks and benefits of colonoscopy with possible polypectomy / biopsies were discussed and the patient agrees to proceed.  Celiac serologies Stop diet sodas for now to see if makes a difference    HPI:    Chief Complaint :  frequent loose stools, urgency, bloating  Interval history: Here with her a friend. Since 2015 she has had problems with nausea , heartburn and diarrhea / bloating. Underwent food allergy testing. Multiple food allergies found. Since avoiding those foods the nausea and heartburn have gotten significantly better but not the diarrhea and bloating /  gas pain.   Diarrhea is often urgent . It starts prior to breakfast. It is not generally triggered by food. She has up to 4 loose, non-bloody BMs a day. Stools not oily. No nocturnal BMs except for on a rare occasion. Rarely are stools formed but when they are they are narrow in caliber.  She is on multiple medications.Takes Adderall 30 mg BID but didn't start it until about 5 years ago ( prior to onset of diarrhea). Some days she doesn't take Adderall and hasn't noticed any improvement in diarrhea but hasn't really tried to pay attention either. Her  weight is stable.   Mother and brother have IBS. No IBD in family as far as she knows.    Past Medical History:  Diagnosis Date   Anxiety    Asthma    IBS (irritable bowel syndrome)    PTSD (post-traumatic stress disorder)    Past Surgical History:  Procedure Laterality Date   BREAST SURGERY     KNEE SURGERY     TONSILLECTOMY     Family History  Problem Relation Age of Onset   Colon polyps Mother    Heart disease Father    Diabetes Maternal Grandfather    Social History   Tobacco Use   Smoking status: Never   Smokeless tobacco: Never  Substance Use Topics   Alcohol use: No   Drug use: Yes    Types: Marijuana   Current Outpatient Medications  Medication Sig Dispense Refill   acetaminophen (TYLENOL) 500 MG tablet Take 500 mg by mouth every 6 (six) hours as needed.     amphetamine-dextroamphetamine (ADDERALL) 30 MG tablet Take 1 tablet by mouth 2 (two) times daily.     budesonide-formoterol (SYMBICORT) 160-4.5 MCG/ACT inhaler Inhale 2 puffs into the lungs in the morning and at bedtime. with spacer and rinse mouth afterwards. 10.2 g 5   clonazePAM (KLONOPIN) 1 MG tablet TAKE 1 TABLET BY MOUTH 4 TIMES A DAY AS NEEDED     EPINEPHrine 0.3 mg/0.3 mL IJ SOAJ injection      fluticasone (FLONASE) 50 MCG/ACT nasal spray PLACE 1 SPRAY INTO BOTH  NOSTRILS 2 (TWO) TIMES DAILY AS NEEDED FOR NASAL CONGESTION 16 mL 0   gabapentin (NEURONTIN) 300 MG capsule TAKE 2 CAPSULES BY MOUTH 2 3 TIMES A DAY     levalbuterol (XOPENEX HFA) 45 MCG/ACT inhaler INHALE 2 PUFFS INTO THE LUNGS EVERY 4 (FOUR) HOURS AS NEEDED FOR WHEEZING OR SHORTNESS OF BREATH (COUGHING). 15 each 0   levocetirizine (XYZAL) 5 MG tablet TAKE 1 TABLET BY MOUTH EVERY DAY IN THE EVENING 90 tablet 1   norethindrone-ethinyl estradiol (LOESTRIN FE) 1-20 MG-MCG tablet      ondansetron (ZOFRAN) 4 MG tablet Take 4 mg by mouth every 6 (six) hours as needed.     orphenadrine (NORFLEX) 100 MG tablet TAKE 1 TABLET BY MOUTH TWICE A DAY IN  THE MORNING AND EVENING AS NEEDED     oxyCODONE (OXY IR/ROXICODONE) 5 MG immediate release tablet Take 5 mg by mouth every 6 (six) hours as needed.     prazosin (MINIPRESS) 1 MG capsule      sertraline (ZOLOFT) 100 MG tablet Take 200 mg by mouth daily.     Spacer/Aero-Holding Chambers (AEROCHAMBER PLUS) inhaler Use as instructed 1 each 2   Spacer/Aero-Holding Dorise Bullion Use as directed with your inhaler 1 each 0   traMADol (ULTRAM) 50 MG tablet Take 50 mg by mouth daily as needed (pain).      traZODone (DESYREL) 50 MG tablet TAKE 1 TO 3 TABLETS BY MOUTH AT BEDTIME     No current facility-administered medications for this visit.   No Known Allergies   Review of Systems: Positive for allergy, sinus trouble, anxiety, back pain, depression, headaches, muscle pain and cramps, night sweats, excessive thirst, frequent urination. All other systems reviewed and negative except where noted in HPI.   Wt Readings from Last 3 Encounters:  05/09/22 208 lb (94.3 kg)  05/02/21 205 lb 3.2 oz (93.1 kg)  07/25/20 209 lb 6.4 oz (95 kg)    Physical Exam   Ht '5\' 8"'$  (1.727 m)   Wt 208 lb (94.3 kg)   BMI 31.63 kg/m  Constitutional:  Generally well appearing female in no acute distress. Psychiatric: Pleasant. Normal mood and affect. Behavior is normal. EENT: Pupils normal.  Conjunctivae are normal. No scleral icterus. Neck supple.  Cardiovascular: Normal rate, regular rhythm.  Pulmonary/chest: Effort normal and breath sounds normal. No wheezing, rales or rhonchi. Abdominal: Soft, nondistended, nontender. Bowel sounds active throughout. There are no masses palpable. No hepatomegaly. Neurological: Alert and oriented to person place and time. Extremities: No edema Skin: Skin is warm and dry. No rashes noted.  Tye Savoy, NP  05/09/2022, 10:48 AM  Cc:  Referring Provider Bernerd Limbo, MD

## 2022-05-09 NOTE — Progress Notes (Signed)
Addendum: Reviewed and agree with assessment and management plan. Wanda Rideout M, MD  

## 2022-05-10 LAB — TISSUE TRANSGLUTAMINASE, IGA: (tTG) Ab, IgA: <1 U/mL

## 2022-05-10 LAB — IGA: Immunoglobulin A: 269 mg/dL (ref 47–310)

## 2022-05-20 DIAGNOSIS — M5136 Other intervertebral disc degeneration, lumbar region: Secondary | ICD-10-CM | POA: Diagnosis not present

## 2022-05-20 DIAGNOSIS — G894 Chronic pain syndrome: Secondary | ICD-10-CM | POA: Diagnosis not present

## 2022-05-22 ENCOUNTER — Other Ambulatory Visit: Payer: Self-pay | Admitting: Family

## 2022-05-30 ENCOUNTER — Other Ambulatory Visit: Payer: Self-pay | Admitting: Family

## 2022-06-17 ENCOUNTER — Ambulatory Visit (AMBULATORY_SURGERY_CENTER): Payer: BC Managed Care – PPO | Admitting: Internal Medicine

## 2022-06-17 ENCOUNTER — Encounter: Payer: Self-pay | Admitting: Internal Medicine

## 2022-06-17 ENCOUNTER — Other Ambulatory Visit: Payer: Self-pay | Admitting: Internal Medicine

## 2022-06-17 VITALS — BP 122/78 | HR 70 | Temp 98.0°F | Resp 17 | Ht 68.0 in | Wt 208.0 lb

## 2022-06-17 DIAGNOSIS — R197 Diarrhea, unspecified: Secondary | ICD-10-CM | POA: Diagnosis not present

## 2022-06-17 DIAGNOSIS — R140 Abdominal distension (gaseous): Secondary | ICD-10-CM

## 2022-06-17 DIAGNOSIS — K635 Polyp of colon: Secondary | ICD-10-CM | POA: Diagnosis not present

## 2022-06-17 DIAGNOSIS — D122 Benign neoplasm of ascending colon: Secondary | ICD-10-CM | POA: Diagnosis not present

## 2022-06-17 DIAGNOSIS — K529 Noninfective gastroenteritis and colitis, unspecified: Secondary | ICD-10-CM

## 2022-06-17 MED ORDER — SODIUM CHLORIDE 0.9 % IV SOLN: 500.0000 mL | INTRAVENOUS | Status: DC

## 2022-06-17 NOTE — Progress Notes (Signed)
To PACU, VSS. Report to [email or web address] 

## 2022-06-17 NOTE — Progress Notes (Signed)
Called to room to assist during endoscopic procedure.  Patient ID and intended procedure confirmed with present staff. Received instructions for my participation in the procedure from the performing physician.  

## 2022-06-17 NOTE — Op Note (Signed)
[facility] Patient Name: [place] Procedure Date: 06/17/2022 1:22 PM MRN: [id number] Endoscopist: Jerene Bears , MD, [id number] Age: 42 Referring MD:  Date of Birth: 27-Aug-1980 Gender: Female Account #: [id number] Procedure:                Colonoscopy Indications:              Chronic diarrhea, abdominal bloating Medicines:                Monitored Anesthesia Care Procedure:                Pre-Anesthesia Assessment:                           - Prior to the procedure, a History and Physical                            was performed, and patient medications and                            allergies were reviewed. The patient's tolerance of                            previous anesthesia was also reviewed. The risks                            and benefits of the procedure and the sedation                            options and risks were discussed with the patient.                            All questions were answered, and informed consent                            was obtained. Prior Anticoagulants: The patient has                            taken no anticoagulant or antiplatelet agents. ASA                            Grade Assessment: II - A patient with mild systemic                            disease. After reviewing the risks and benefits,                            the patient was deemed in satisfactory condition to                            undergo the procedure.                           After obtaining informed consent, the colonoscope  was passed under direct vision. Throughout the                            procedure, the patient's blood pressure, pulse, and                            oxygen saturations were monitored continuously. The                            Olympus CF-HQ190L [id number]) Colonoscope was                            introduced through the anus and advanced to the                            terminal ileum. The  colonoscopy was performed                            without difficulty. The patient tolerated the                            procedure well. The quality of the bowel                            preparation was excellent. The terminal ileum,                            ileocecal valve, appendiceal orifice, and rectum                            were photographed. Scope In: 2:09:20 PM Scope Out: 2:22:00 PM Scope Withdrawal Time: 0 hours 9 minutes 56 seconds  Total Procedure Duration: 0 hours 12 minutes 40 seconds  Findings:                 The digital rectal exam was normal.                           The terminal ileum appeared normal.                           Two sessile polyps were found in the ascending                            colon. The polyps were 4 to 5 mm in size. These                            polyps were removed with a cold snare. Resection                            and retrieval were complete.                           The exam was otherwise without abnormality on  direct and retroflexion views.                           Biopsies for histology were taken with a cold                            forceps from the right colon and left colon for                            evaluation of microscopic colitis. Complications:            No immediate complications. Estimated Blood Loss:     Estimated blood loss was minimal. Impression:               - The examined portion of the ileum was normal.                           - Two 4 to 5 mm polyps in the ascending colon,                            removed with a cold snare. Resected and retrieved.                           - The examination was otherwise normal on direct                            and retroflexion views.                           - Biopsies were taken with a cold forceps from the                            right colon and left colon for evaluation of                            microscopic  colitis. Recommendation:           - Patient has a contact number available for                            emergencies. The signs and symptoms of potential                            delayed complications were discussed with the                            patient. Return to normal activities tomorrow.                            Written discharge instructions were provided to the                            patient.                           -  Resume previous diet.                           - Continue present medications.                           - Await pathology results.                           - Repeat colonoscopy is recommended. The                            colonoscopy date will be determined after pathology                            results from today's exam become available for                            review. Jerene Bears, MD 06/17/2022 2:24:59 PM This report has been signed electronically.

## 2022-06-17 NOTE — Progress Notes (Signed)
GASTROENTEROLOGY PROCEDURE H&P NOTE   Primary Care Physician: Vernie Shanks, MD (Inactive)    Reason for Procedure:   Chronic diarrhea  Plan:    colonoscopy  Patient is appropriate for endoscopic procedure(s) in the ambulatory ([facility]) setting.  The nature of the procedure, as well as the risks, benefits, and alternatives were carefully and thoroughly reviewed with the patient. Ample time for discussion and questions allowed. The patient understood, was satisfied, and agreed to proceed.     HPI: Cassandra Baxter is a 42 y.o. female who presents for colonoscopy.  Medical history as below.  Tolerated the prep.  No recent chest pain or shortness of breath.  No abdominal pain today.  Past Medical History:  Diagnosis Date   Anxiety    Asthma    IBS (irritable bowel syndrome)    PTSD (post-traumatic stress disorder)     Past Surgical History:  Procedure Laterality Date   BREAST SURGERY     KNEE SURGERY     TONSILLECTOMY      Prior to Admission medications   Medication Sig Start Date End Date Taking? Authorizing Provider  acetaminophen (TYLENOL) 500 MG tablet Take 500 mg by mouth every 6 (six) hours as needed.   Yes [provider]  budesonide-formoterol (SYMBICORT) 160-4.5 MCG/ACT inhaler INHALE 2 PUFFS INTO THE LUNGS IN THE MORNING AND AT BEDTIME 05/30/22  Yes Althea Charon, FNP  clonazePAM (KLONOPIN) 1 MG tablet TAKE 1 TABLET BY MOUTH 4 TIMES A DAY AS NEEDED 08/19/18  Yes [provider]  fluticasone (FLONASE) 50 MCG/ACT nasal spray PLACE 1 SPRAY INTO BOTH NOSTRILS 2 (TWO) TIMES DAILY AS NEEDED FOR NASAL CONGESTION 05/02/21  Yes Althea Charon, FNP  gabapentin (NEURONTIN) 300 MG capsule TAKE 2 CAPSULES BY MOUTH 2 3 TIMES A DAY 08/17/18  Yes [provider]  HAILEY 24 FE 1-20 MG-MCG(24) tablet Take 1 tablet by mouth daily. 04/02/22  Yes [provider]  levocetirizine (XYZAL) 5 MG tablet TAKE 1 TABLET BY MOUTH EVERY DAY IN THE EVENING 05/22/22   Yes Althea Charon, FNP  Na Sulfate-K Sulfate-Mg Sulf (SUPREP BOWEL PREP KIT) 17.5-3.13-1.6 GM/177ML SOLN Take 1 kit by mouth as directed. For colonoscopy prep 05/09/22  Yes Willia Craze, NP  orphenadrine (NORFLEX) 100 MG tablet TAKE 1 TABLET BY MOUTH TWICE A DAY IN THE MORNING AND EVENING AS NEEDED 08/30/18  Yes [provider]  prazosin (MINIPRESS) 1 MG capsule  08/17/18  Yes [provider]  sertraline (ZOLOFT) 100 MG tablet Take 200 mg by mouth daily. 11/01/21  Yes [provider]  Spacer/Aero-Holding Chambers (AEROCHAMBER PLUS) inhaler Use as instructed 04/09/21  Yes Garnet Sierras, DO  Spacer/Aero-Holding [facility] Use as directed with your inhaler 05/02/21  Yes Althea Charon, FNP  traMADol (ULTRAM) 50 MG tablet Take 50 mg by mouth daily as needed (pain).    Yes [provider]  traZODone (DESYREL) 50 MG tablet TAKE 1 TO 3 TABLETS BY MOUTH AT BEDTIME 08/30/18  Yes [provider]  amphetamine-dextroamphetamine (ADDERALL) 30 MG tablet Take 1 tablet by mouth 2 (two) times daily. 11/15/21   [provider]  EPINEPHrine 0.3 mg/0.3 mL IJ SOAJ injection  03/30/16   [provider]  levalbuterol (XOPENEX HFA) 45 MCG/ACT inhaler INHALE 2 PUFFS INTO THE LUNGS EVERY 4 (FOUR) HOURS AS NEEDED FOR WHEEZING OR SHORTNESS OF BREATH (COUGHING). 10/02/21   Althea Charon, FNP  meloxicam (MOBIC) 7.5 MG tablet Take 7.5 mg by mouth 2 (two) times  daily. 06/16/22   [provider]    Current Outpatient Medications  Medication Sig Dispense Refill   acetaminophen (TYLENOL) 500 MG tablet Take 500 mg by mouth every 6 (six) hours as needed.     budesonide-formoterol (SYMBICORT) 160-4.5 MCG/ACT inhaler INHALE 2 PUFFS INTO THE LUNGS IN THE MORNING AND AT BEDTIME 10.2 g 0   clonazePAM (KLONOPIN) 1 MG tablet TAKE 1 TABLET BY MOUTH 4 TIMES A DAY AS NEEDED     fluticasone (FLONASE) 50 MCG/ACT nasal spray PLACE 1 SPRAY INTO BOTH NOSTRILS 2 (TWO) TIMES DAILY  AS NEEDED FOR NASAL CONGESTION 16 mL 0   gabapentin (NEURONTIN) 300 MG capsule TAKE 2 CAPSULES BY MOUTH 2 3 TIMES A DAY     HAILEY 24 FE 1-20 MG-MCG(24) tablet Take 1 tablet by mouth daily.     levocetirizine (XYZAL) 5 MG tablet TAKE 1 TABLET BY MOUTH EVERY DAY IN THE EVENING 30 tablet 0   Na Sulfate-K Sulfate-Mg Sulf (SUPREP BOWEL PREP KIT) 17.5-3.13-1.6 GM/177ML SOLN Take 1 kit by mouth as directed. For colonoscopy prep 354 mL 0   orphenadrine (NORFLEX) 100 MG tablet TAKE 1 TABLET BY MOUTH TWICE A DAY IN THE MORNING AND EVENING AS NEEDED     prazosin (MINIPRESS) 1 MG capsule      sertraline (ZOLOFT) 100 MG tablet Take 200 mg by mouth daily.     Spacer/Aero-Holding Chambers (AEROCHAMBER PLUS) inhaler Use as instructed 1 each 2   Spacer/Aero-Holding Dorise Bullion Use as directed with your inhaler 1 each 0   traMADol (ULTRAM) 50 MG tablet Take 50 mg by mouth daily as needed (pain).      traZODone (DESYREL) 50 MG tablet TAKE 1 TO 3 TABLETS BY MOUTH AT BEDTIME     amphetamine-dextroamphetamine (ADDERALL) 30 MG tablet Take 1 tablet by mouth 2 (two) times daily.     EPINEPHrine 0.3 mg/0.3 mL IJ SOAJ injection      levalbuterol (XOPENEX HFA) 45 MCG/ACT inhaler INHALE 2 PUFFS INTO THE LUNGS EVERY 4 (FOUR) HOURS AS NEEDED FOR WHEEZING OR SHORTNESS OF BREATH (COUGHING). 15 each 0   meloxicam (MOBIC) 7.5 MG tablet Take 7.5 mg by mouth 2 (two) times daily.     No current facility-administered medications for this visit.    Allergies as of 06/17/2022   (No Known Allergies)    Family History  Problem Relation Age of Onset   Colon polyps Mother    Heart disease Father    Diabetes Maternal Grandfather    Colon cancer Neg Hx    Esophageal cancer Neg Hx    Rectal cancer Neg Hx    Stomach cancer Neg Hx     Social History   Socioeconomic History   Marital status: Married    Spouse name: Not on file   Number of children: 2   Years of education: Not on file   Highest education level: Not on file   Occupational History   Occupation: Probation officer  Tobacco Use   Smoking status: Never   Smokeless tobacco: Never  Substance and Sexual Activity   Alcohol use: No   Drug use: Yes    Types: Marijuana    Comment: sparingly   Sexual activity: Yes  Other Topics Concern   Not on file  Social History Narrative   Not on file   Social Determinants of Health   Financial Resource Strain: Not on file  Food Insecurity: Not on file  Transportation Needs: Not on file  Physical Activity: Not  on file  Stress: Not on file  Social Connections: Not on file  Intimate Partner Violence: Not on file    Physical Exam: Vital signs in last 24 hours: @BP$  (!) 143/94   Pulse 77   Temp 98 F (36.7 C) (Temporal)   Resp 20   Ht 5' 8"$  (1.727 m)   Wt 208 lb (94.3 kg)   SpO2 100%   BMI 31.63 kg/m  GEN: NAD EYE: Sclerae anicteric ENT: MMM CV: Non-tachycardic Pulm: CTA b/l GI: Soft, NT/ND NEURO:  Alert & Oriented x 3   Zenovia Jarred, MD [facility]  06/17/2022 2:06 PM

## 2022-06-17 NOTE — Patient Instructions (Signed)
Information on polyps given to you today.  Await pathology results.  Resume previous diet and medications.    YOU HAD AN ENDOSCOPIC PROCEDURE TODAY AT THE [facility]:   Refer to the procedure report that was given to you for any specific questions about what was found during the examination.  If the procedure report does not answer your questions, please call your gastroenterologist to clarify.  If you requested that your care partner not be given the details of your procedure findings, then the procedure report has been included in a sealed envelope for you to review at your convenience later.  YOU SHOULD EXPECT: Some feelings of bloating in the abdomen. Passage of more gas than usual.  Walking can help get rid of the air that was put into your GI tract during the procedure and reduce the bloating. If you had a lower endoscopy (such as a colonoscopy or flexible sigmoidoscopy) you may notice spotting of blood in your stool or on the toilet paper. If you underwent a bowel prep for your procedure, you may not have a normal bowel movement for a few days.  Please Note:  You might notice some irritation and congestion in your nose or some drainage.  This is from the oxygen used during your procedure.  There is no need for concern and it should clear up in a day or so.  SYMPTOMS TO REPORT IMMEDIATELY:  Following lower endoscopy (colonoscopy or flexible sigmoidoscopy):  Excessive amounts of blood in the stool  Significant tenderness or worsening of abdominal pains  Swelling of the abdomen that is new, acute  Fever of 100F or higher  For urgent or emergent issues, a gastroenterologist can be reached at any hour by calling [phone]. Do not use MyChart messaging for urgent concerns.    DIET:  We do recommend a small meal at first, but then you may proceed to your regular diet.  Drink plenty of fluids but you should avoid alcoholic beverages for 24 hours.  ACTIVITY:  You should  plan to take it easy for the rest of today and you should NOT DRIVE or use heavy machinery until tomorrow (because of the sedation medicines used during the test).    FOLLOW UP: Our staff will call the number listed on your records the next business day following your procedure.  We will call around 7:15- 8:00 am to check on you and address any questions or concerns that you may have regarding the information given to you following your procedure. If we do not reach you, we will leave a message.     If any biopsies were taken you will be contacted by phone or by letter within the next 1-3 weeks.  Please call us at [phone] if you have not heard about the biopsies in 3 weeks.    SIGNATURES/CONFIDENTIALITY: You and/or your care partner have signed paperwork which will be entered into your electronic medical record.  These signatures attest to the fact that that the information above on your After Visit Summary has been reviewed and is understood.  Full responsibility of the confidentiality of this discharge information lies with you and/or your care-partner.  

## 2022-06-17 NOTE — Progress Notes (Signed)
Pt's states no medical or surgical changes since previsit or office visit. 

## 2022-06-18 ENCOUNTER — Telehealth: Payer: Self-pay | Admitting: *Deleted

## 2022-06-18 NOTE — Telephone Encounter (Signed)
Follow up call attempt.  No answer and no voicemail.

## 2022-06-20 ENCOUNTER — Other Ambulatory Visit: Payer: Self-pay | Admitting: Family

## 2022-06-24 ENCOUNTER — Other Ambulatory Visit: Payer: Self-pay

## 2022-06-24 MED ORDER — RIFAXIMIN 550 MG PO TABS: 550.0000 mg | ORAL_TABLET | Freq: Three times a day (TID) | ORAL | 0 refills | Status: AC

## 2022-06-26 ENCOUNTER — Telehealth: Payer: Self-pay | Admitting: Pharmacy Technician

## 2022-06-26 NOTE — Telephone Encounter (Signed)
Patient Advocate Encounter  Received notification from [facility] that prior authorization for Cassandra Baxter is required.   PA submitted on 2.29.24 Key [id number] Status is pending

## 2022-06-29 ENCOUNTER — Other Ambulatory Visit: Payer: Self-pay | Admitting: Family

## 2022-07-14 NOTE — Patient Instructions (Incomplete)
Asthma: Daily controller medication(s): Continue Symbicort 152mcg 2 puffs twice a day with spacer and rinse mouth afterwards.Refill sent May use levoalbuterol rescue inhaler 2 puffs every 4 to 6 hours as needed for shortness of breath, chest tightness, coughing, and wheezing. May use levoalbuterol rescue inhaler 2 puffs 5 to 15 minutes prior to strenuous physical activities. Monitor frequency of use.  The levoalbuterol rescue inhaler should not make you have heart palpitations.  Asthma control goals:  Full participation in all desired activities (may need albuterol before activity) Albuterol use two times or less a week on average (not counting use with activity) Cough interfering with sleep two times or less a month Oral steroids no more than once a year No hospitalizations  Allergic rhinoconjunctivitis: Past skin testing was positive to grass, weeds, ragweed, tree, mold, dust mites, cat, dog and horse. Continue environmental control measures.  May use over the counter antihistamines such as Xyzal (levocetirizine) daily as needed. May use Flonase (fluticasone) nasal spray 1 spray per nostril twice a day as needed for nasal congestion.  Nasal saline spray (i.e., Simply Saline) or nasal saline lavage (i.e., NeilMed) is recommended as needed and prior to medicated nasal sprays. Consider allergy injections in the future once breathing under better control. Start Pataday (olopatadine) 1 drop in each eye once a day as needed for itchy watery eyes    Reflux: Continue lifestyle and dietary modifications. Continue omeprazole 20mg  daily as needed - nothing to eat or drink for 30 minutes afterwards.   Your blood pressure was elevated in the office today. Recommend that you schedule an appointment with your primary care physician  Follow up in 4-6 months or sooner if needed.  Reducing Pollen Exposure Pollen seasons: trees (spring), grass (summer) and ragweed/weeds (fall). Keep windows closed in  your home and car to lower pollen exposure.  Install air conditioning in the bedroom and throughout the house if possible.  Avoid going out in dry windy days - especially early morning. Pollen counts are highest between 5 - 10 AM and on dry, hot and windy days.  Save outside activities for late afternoon or after a heavy rain, when pollen levels are lower.  Avoid mowing of grass if you have grass pollen allergy. Be aware that pollen can also be transported indoors on people and pets.  Dry your clothes in an automatic dryer rather than hanging them outside where they might collect pollen.  Rinse hair and eyes before bedtime. Mold Control Mold and fungi can grow on a variety of surfaces provided certain temperature and moisture conditions exist.  Outdoor molds grow on plants, decaying vegetation and soil. The major outdoor mold, Alternaria and Cladosporium, are found in very high numbers during hot and dry conditions. Generally, a late summer - fall peak is seen for common outdoor fungal spores. Rain will temporarily lower outdoor mold spore count, but counts rise rapidly when the rainy period ends. The most important indoor molds are Aspergillus and Penicillium. Dark, humid and poorly ventilated basements are ideal sites for mold growth. The next most common sites of mold growth are the bathroom and the kitchen. Outdoor (Seasonal) Mold Control Use air conditioning and keep windows closed. Avoid exposure to decaying vegetation. Avoid leaf raking. Avoid grain handling. Consider wearing a face mask if working in moldy areas.  Indoor (Perennial) Mold Control  Maintain humidity below 50%. Get rid of mold growth on hard surfaces with water, detergent and, if necessary, 5% bleach (do not mix with other cleaners). Then  dry the area completely. If mold covers an area more than 10 square feet, consider hiring an indoor environmental professional. For clothing, washing with soap and water is best. If moldy  items cannot be cleaned and dried, throw them away. Remove sources e.g. contaminated carpets. Repair and seal leaking roofs or pipes. Using dehumidifiers in damp basements may be helpful, but empty the water and clean units regularly to prevent mildew from forming. All rooms, especially basements, bathrooms and kitchens, require ventilation and cleaning to deter mold and mildew growth. Avoid carpeting on concrete or damp floors, and storing items in damp areas. Control of House Dust Mite Allergen Dust mite allergens are a common trigger of allergy and asthma symptoms. While they can be found throughout the house, these microscopic creatures thrive in warm, humid environments such as bedding, upholstered furniture and carpeting. Because so much time is spent in the bedroom, it is essential to reduce mite levels there.  Encase pillows, mattresses, and box springs in special allergen-proof fabric covers or airtight, zippered plastic covers.  Bedding should be washed weekly in hot water (130 F) and dried in a hot dryer. Allergen-proof covers are available for comforters and pillows that can't be regularly washed.  Wash the allergy-proof covers every few months. Minimize clutter in the bedroom. Keep pets out of the bedroom.  Keep humidity less than 50% by using a dehumidifier or air conditioning. You can buy a humidity measuring device called a hygrometer to monitor this.  If possible, replace carpets with hardwood, linoleum, or washable area rugs. If that's not possible, vacuum frequently with a vacuum that has a HEPA filter. Remove all upholstered furniture and non-washable window drapes from the bedroom. Remove all non-washable stuffed toys from the bedroom.  Wash stuffed toys weekly. Pet Allergen Avoidance: Contrary to popular opinion, there are no "hypoallergenic" breeds of dogs or cats. That is because people are not allergic to an animal's hair, but to an allergen found in the animal's saliva,  dander (dead skin flakes) or urine. Pet allergy symptoms typically occur within minutes. For some people, symptoms can build up and become most severe 8 to 12 hours after contact with the animal. People with severe allergies can experience reactions in public places if dander has been transported on the pet owners' clothing. Keeping an animal outdoors is only a partial solution, since homes with pets in the yard still have higher concentrations of animal allergens. Before getting a pet, ask your allergist to determine if you are allergic to animals. If your pet is already considered part of your family, try to minimize contact and keep the pet out of the bedroom and other rooms where you spend a great deal of time. As with dust mites, vacuum carpets often or replace carpet with a hardwood floor, tile or linoleum. High-efficiency particulate air (HEPA) cleaners can reduce allergen levels over time. While dander and saliva are the source of cat and dog allergens, urine is the source of allergens from rabbits, hamsters, mice and [place] pigs; so ask a non-allergic family member to clean the animal's cage. If you have a pet allergy, talk to your allergist about the potential for allergy immunotherapy (allergy shots). This strategy can often provide long-term relief.

## 2022-07-15 ENCOUNTER — Ambulatory Visit (INDEPENDENT_AMBULATORY_CARE_PROVIDER_SITE_OTHER): Payer: BC Managed Care – PPO | Admitting: Family

## 2022-07-15 ENCOUNTER — Other Ambulatory Visit: Payer: Self-pay

## 2022-07-15 ENCOUNTER — Other Ambulatory Visit: Payer: Self-pay | Admitting: Family

## 2022-07-15 ENCOUNTER — Encounter: Payer: Self-pay | Admitting: Family

## 2022-07-15 VITALS — BP 142/98 | HR 82 | Temp 98.4°F | Resp 18 | Ht 69.5 in | Wt 211.2 lb

## 2022-07-15 DIAGNOSIS — H1013 Acute atopic conjunctivitis, bilateral: Secondary | ICD-10-CM | POA: Diagnosis not present

## 2022-07-15 DIAGNOSIS — H1010 Acute atopic conjunctivitis, unspecified eye: Secondary | ICD-10-CM

## 2022-07-15 DIAGNOSIS — K219 Gastro-esophageal reflux disease without esophagitis: Secondary | ICD-10-CM | POA: Diagnosis not present

## 2022-07-15 DIAGNOSIS — J4540 Moderate persistent asthma, uncomplicated: Secondary | ICD-10-CM

## 2022-07-15 DIAGNOSIS — J302 Other seasonal allergic rhinitis: Secondary | ICD-10-CM | POA: Diagnosis not present

## 2022-07-15 DIAGNOSIS — J3089 Other allergic rhinitis: Secondary | ICD-10-CM

## 2022-07-15 MED ORDER — BUDESONIDE-FORMOTEROL FUMARATE 160-4.5 MCG/ACT IN AERO: 2.0000 | INHALATION_SPRAY | Freq: Two times a day (BID) | RESPIRATORY_TRACT | 5 refills | Status: DC

## 2022-07-15 MED ORDER — OLOPATADINE HCL 0.2 % OP SOLN: OPHTHALMIC | 5 refills | Status: AC

## 2022-07-15 MED ORDER — LEVALBUTEROL TARTRATE 45 MCG/ACT IN AERO: 2.0000 | INHALATION_SPRAY | Freq: Four times a day (QID) | RESPIRATORY_TRACT | 1 refills | Status: DC | PRN

## 2022-07-15 MED ORDER — LEVOCETIRIZINE DIHYDROCHLORIDE 5 MG PO TABS: 5.0000 mg | ORAL_TABLET | Freq: Every evening | ORAL | 5 refills | Status: DC

## 2022-07-15 MED ORDER — FLUTICASONE PROPIONATE 50 MCG/ACT NA SUSP: NASAL | 5 refills | Status: AC

## 2022-07-15 NOTE — Progress Notes (Signed)
[place] [address] Dept: [phone]  FOLLOW UP NOTE  Patient ID: Cassandra Baxter, female    DOB: April 12, 1981  Age: 42 y.o. MRN: [id number] Date of Office Visit: 07/15/2022  Assessment  Chief Complaint: Nasal Congestion, Cough, Medication Refill, and Follow-up  HPI Cassandra Baxter is a 42 year old female who presents today for follow-up of not well-controlled moderate persistent asthma, seasonal and perennial allergic rhinoconjunctivitis, and gastroesophageal reflux disease.  She was last seen on May 02, 2021 by myself.  She did not follow-up as recommended in 3 months.  Since her last office visit she did have surgery on her meniscus of her right knee on July 31, 2021.  Asthma: She has currently been out of Symbicort 160/4.5 mcg 2 puffs twice a day with spacer for the past week.  During this week she has been using her albuterol 2 times a day.  Prior to being out of her Symbicort she was having to use her albuterol maybe once a month.  Since being out of her Symbicort this past week and possibly due to pollen she has had a dry cough and some tightness in her chest.  She denies fever, chills, wheezing, shortness of breath, and nocturnal awakenings due to breathing problems.  Since her last office visit she has not required any systemic steroids or made any trips to the emergency room or urgent care due to breathing problems.  Allergic rhinoconjunctivitis: She reports this week she has had some nasal congestion and postnasal drip.  She has also had itchy eyes.  She denies rhinorrhea.  She has not had any sinus infections since we last saw her.  She uses fluticasone nasal spray, levocetirizine at night and saline spray daily.  She really feels like this combination does help.  Gastroesophageal reflux disease: She is currently only taking omeprazole as needed and is not taking famotidine.  She reports that her reflux has been good, but she has had a little bit more lately because  she ate chocolate.   Drug Allergies:  No Known Allergies  Review of Systems: Review of Systems  Constitutional:  Negative for chills and fever.  HENT:         Reports that this week she has had nasal congestion and last night and this week she has also had postnasal drip.  Denies rhinorrhea  Eyes:        Reports itchy eyes slightly  Respiratory:  Positive for cough. Negative for shortness of breath and wheezing.        Reports dry cough and some tightness in her chest.  Denies wheezing, shortness of breath, and nocturnal awakenings due to breathing problems  Cardiovascular:  Negative for chest pain and palpitations.       Reports that she will have increased heart rate at times due to her anxiety  Gastrointestinal:        Reports that she has had a little bit more reflux lately only because she ate chocolate, but other wise it has been good  Genitourinary:  Negative for frequency.  Skin:  Negative for itching and rash.  Neurological:  Negative for headaches.  Endo/Heme/Allergies:  Positive for environmental allergies.     Physical Exam: BP (!) 142/98 (BP Location: Right Arm, Patient Position: Sitting, Cuff Size: Large)   Pulse 82   Temp 98.4 F (36.9 C) (Temporal)   Resp 18   Ht 5' 9.5" (1.765 m)   Wt 211 lb 3.2 oz (95.8 kg)   SpO2  100%   BMI 30.74 kg/m    Physical Exam Exam conducted with a chaperone present.  Constitutional:      Appearance: Normal appearance.  HENT:     Head: Normocephalic and atraumatic.     Comments: Pharynx normal, eyes normal, ears normal, nose: Bilateral lower turbinates currently edematous with no drainage noted    Right Ear: Tympanic membrane, ear canal and external ear normal.     Left Ear: Tympanic membrane, ear canal and external ear normal.     Mouth/Throat:     Mouth: Mucous membranes are moist.     Pharynx: Oropharynx is clear.  Eyes:     Conjunctiva/sclera: Conjunctivae normal.  Cardiovascular:     Rate and Rhythm: Normal rate and  regular rhythm.     Heart sounds: Normal heart sounds.  Pulmonary:     Effort: Pulmonary effort is normal.     Breath sounds: Normal breath sounds.     Comments: Lungs clear to auscultation Musculoskeletal:     Cervical back: Neck supple.  Skin:    General: Skin is warm.  Neurological:     Mental Status: She is alert and oriented to person, place, and time.  Psychiatric:        Mood and Affect: Mood normal.        Behavior: Behavior normal.        Thought Content: Thought content normal.        Judgment: Judgment normal.     Diagnostics: FVC 4.65 L (104%), FEV1 3.10 L (86%).  Spirometry indicates mild airway obstruction.  Assessment and Plan: 1. Not well controlled moderate persistent asthma   2. Seasonal and perennial allergic rhinoconjunctivitis   3. Gastroesophageal reflux disease, unspecified whether esophagitis present     Meds ordered this encounter  Medications   budesonide-formoterol (SYMBICORT) 160-4.5 MCG/ACT inhaler    Sig: Inhale 2 puffs into the lungs 2 (two) times daily. in the morning and at bedtime.    Dispense:  10.2 g    Refill:  5   fluticasone (FLONASE) 50 MCG/ACT nasal spray    Sig: PLACE 1 SPRAY INTO BOTH NOSTRILS 2 (TWO) TIMES DAILY AS NEEDED FOR NASAL CONGESTION    Dispense:  16 g    Refill:  5   levocetirizine (XYZAL) 5 MG tablet    Sig: Take 1 tablet (5 mg total) by mouth every evening.    Dispense:  30 tablet    Refill:  5   Olopatadine HCl 0.2 % SOLN    Sig: Place 1 drop in each eye once a day as needed for itchy watery eyes    Dispense:  2.5 mL    Refill:  5   levalbuterol (XOPENEX HFA) 45 MCG/ACT inhaler    Sig: Inhale 2 puffs into the lungs every 6 (six) hours as needed for wheezing or shortness of breath (coughing).    Dispense:  15 g    Refill:  1    Patient Instructions  Asthma: Daily controller medication(s): Continue Symbicort 126mcg 2 puffs twice a day with spacer and rinse mouth afterwards.Refill sent May use levoalbuterol  rescue inhaler 2 puffs every 4 to 6 hours as needed for shortness of breath, chest tightness, coughing, and wheezing. May use levoalbuterol rescue inhaler 2 puffs 5 to 15 minutes prior to strenuous physical activities. Monitor frequency of use.  The levoalbuterol rescue inhaler should not make you have heart palpitations.  Asthma control goals:  Full participation in all desired activities (may need  albuterol before activity) Albuterol use two times or less a week on average (not counting use with activity) Cough interfering with sleep two times or less a month Oral steroids no more than once a year No hospitalizations  Allergic rhinoconjunctivitis: Past skin testing was positive to grass, weeds, ragweed, tree, mold, dust mites, cat, dog and horse. Continue environmental control measures.  May use over the counter antihistamines such as Xyzal (levocetirizine) daily as needed. May use Flonase (fluticasone) nasal spray 1 spray per nostril twice a day as needed for nasal congestion.  Nasal saline spray (i.e., Simply Saline) or nasal saline lavage (i.e., NeilMed) is recommended as needed and prior to medicated nasal sprays. Consider allergy injections in the future once breathing under better control. Start Pataday (olopatadine) 1 drop in each eye once a day as needed for itchy watery eyes    Reflux: Continue lifestyle and dietary modifications. Continue omeprazole 20mg  daily as needed - nothing to eat or drink for 30 minutes afterwards.   Your blood pressure was elevated in the office today. Recommend that you schedule an appointment with your primary care physician  Follow up in 4-6 months or sooner if needed.  Reducing Pollen Exposure Pollen seasons: trees (spring), grass (summer) and ragweed/weeds (fall). Keep windows closed in your home and car to lower pollen exposure.  Install air conditioning in the bedroom and throughout the house if possible.  Avoid going out in dry windy days -  especially early morning. Pollen counts are highest between 5 - 10 AM and on dry, hot and windy days.  Save outside activities for late afternoon or after a heavy rain, when pollen levels are lower.  Avoid mowing of grass if you have grass pollen allergy. Be aware that pollen can also be transported indoors on people and pets.  Dry your clothes in an automatic dryer rather than hanging them outside where they might collect pollen.  Rinse hair and eyes before bedtime. Mold Control Mold and fungi can grow on a variety of surfaces provided certain temperature and moisture conditions exist.  Outdoor molds grow on plants, decaying vegetation and soil. The major outdoor mold, Alternaria and Cladosporium, are found in very high numbers during hot and dry conditions. Generally, a late summer - fall peak is seen for common outdoor fungal spores. Rain will temporarily lower outdoor mold spore count, but counts rise rapidly when the rainy period ends. The most important indoor molds are Aspergillus and Penicillium. Dark, humid and poorly ventilated basements are ideal sites for mold growth. The next most common sites of mold growth are the bathroom and the kitchen. Outdoor (Seasonal) Mold Control Use air conditioning and keep windows closed. Avoid exposure to decaying vegetation. Avoid leaf raking. Avoid grain handling. Consider wearing a face mask if working in moldy areas.  Indoor (Perennial) Mold Control  Maintain humidity below 50%. Get rid of mold growth on hard surfaces with water, detergent and, if necessary, 5% bleach (do not mix with other cleaners). Then dry the area completely. If mold covers an area more than 10 square feet, consider hiring an indoor environmental professional. For clothing, washing with soap and water is best. If moldy items cannot be cleaned and dried, throw them away. Remove sources e.g. contaminated carpets. Repair and seal leaking roofs or pipes. Using dehumidifiers in  damp basements may be helpful, but empty the water and clean units regularly to prevent mildew from forming. All rooms, especially basements, bathrooms and kitchens, require ventilation and cleaning to deter  mold and mildew growth. Avoid carpeting on concrete or damp floors, and storing items in damp areas. Control of House Dust Mite Allergen Dust mite allergens are a common trigger of allergy and asthma symptoms. While they can be found throughout the house, these microscopic creatures thrive in warm, humid environments such as bedding, upholstered furniture and carpeting. Because so much time is spent in the bedroom, it is essential to reduce mite levels there.  Encase pillows, mattresses, and box springs in special allergen-proof fabric covers or airtight, zippered plastic covers.  Bedding should be washed weekly in hot water (130 F) and dried in a hot dryer. Allergen-proof covers are available for comforters and pillows that can't be regularly washed.  Wash the allergy-proof covers every few months. Minimize clutter in the bedroom. Keep pets out of the bedroom.  Keep humidity less than 50% by using a dehumidifier or air conditioning. You can buy a humidity measuring device called a hygrometer to monitor this.  If possible, replace carpets with hardwood, linoleum, or washable area rugs. If that's not possible, vacuum frequently with a vacuum that has a HEPA filter. Remove all upholstered furniture and non-washable window drapes from the bedroom. Remove all non-washable stuffed toys from the bedroom.  Wash stuffed toys weekly. Pet Allergen Avoidance: Contrary to popular opinion, there are no "hypoallergenic" breeds of dogs or cats. That is because people are not allergic to an animal's hair, but to an allergen found in the animal's saliva, dander (dead skin flakes) or urine. Pet allergy symptoms typically occur within minutes. For some people, symptoms can build up and become most severe 8 to 12 hours  after contact with the animal. People with severe allergies can experience reactions in public places if dander has been transported on the pet owners' clothing. Keeping an animal outdoors is only a partial solution, since homes with pets in the yard still have higher concentrations of animal allergens. Before getting a pet, ask your allergist to determine if you are allergic to animals. If your pet is already considered part of your family, try to minimize contact and keep the pet out of the bedroom and other rooms where you spend a great deal of time. As with dust mites, vacuum carpets often or replace carpet with a hardwood floor, tile or linoleum. High-efficiency particulate air (HEPA) cleaners can reduce allergen levels over time. While dander and saliva are the source of cat and dog allergens, urine is the source of allergens from rabbits, hamsters, mice and [place] pigs; so ask a non-allergic family member to clean the animal's cage. If you have a pet allergy, talk to your allergist about the potential for allergy immunotherapy (allergy shots). This strategy can often provide long-term relief.     Return in about 6 months (around 01/15/2023), or if symptoms worsen or fail to improve.    Thank you for the opportunity to care for this patient.  Please do not hesitate to contact me with questions.  Althea Charon, FNP [facility]

## 2022-07-17 NOTE — Telephone Encounter (Signed)
Please see if we can get levalbuterol covered due to tachycardia

## 2022-07-21 ENCOUNTER — Telehealth: Payer: Self-pay

## 2022-07-21 ENCOUNTER — Other Ambulatory Visit (HOSPITAL_COMMUNITY): Payer: Self-pay

## 2022-07-21 NOTE — Telephone Encounter (Signed)
PA has been submitted to plan and is pending determination, will be updated in additional encounter created.  

## 2022-07-21 NOTE — Telephone Encounter (Signed)
PA request received via provider for Levalbuterol Tartrate 45MCG/ACT aerosol  PA has been submitted to [facility] and is pending determination.  Key: [id number]

## 2022-07-23 NOTE — Telephone Encounter (Signed)
PA has been APPROVED from 07/22/2022-07/21/2023

## 2022-08-19 DIAGNOSIS — I10 Essential (primary) hypertension: Secondary | ICD-10-CM | POA: Diagnosis not present

## 2022-08-19 DIAGNOSIS — M5136 Other intervertebral disc degeneration, lumbar region: Secondary | ICD-10-CM | POA: Diagnosis not present

## 2022-08-19 DIAGNOSIS — Z79899 Other long term (current) drug therapy: Secondary | ICD-10-CM | POA: Diagnosis not present

## 2022-08-19 DIAGNOSIS — Z6829 Body mass index (BMI) 29.0-29.9, adult: Secondary | ICD-10-CM | POA: Diagnosis not present

## 2022-08-19 DIAGNOSIS — G894 Chronic pain syndrome: Secondary | ICD-10-CM | POA: Diagnosis not present

## 2022-08-26 DIAGNOSIS — S83281D Other tear of lateral meniscus, current injury, right knee, subsequent encounter: Secondary | ICD-10-CM | POA: Diagnosis not present

## 2022-09-11 DIAGNOSIS — F900 Attention-deficit hyperactivity disorder, predominantly inattentive type: Secondary | ICD-10-CM | POA: Diagnosis not present

## 2022-09-11 DIAGNOSIS — F4310 Post-traumatic stress disorder, unspecified: Secondary | ICD-10-CM | POA: Diagnosis not present

## 2022-09-11 DIAGNOSIS — F3341 Major depressive disorder, recurrent, in partial remission: Secondary | ICD-10-CM | POA: Diagnosis not present

## 2022-11-19 DIAGNOSIS — G894 Chronic pain syndrome: Secondary | ICD-10-CM | POA: Diagnosis not present

## 2022-11-19 DIAGNOSIS — M5136 Other intervertebral disc degeneration, lumbar region: Secondary | ICD-10-CM | POA: Diagnosis not present

## 2022-12-02 DIAGNOSIS — M25572 Pain in left ankle and joints of left foot: Secondary | ICD-10-CM | POA: Diagnosis not present

## 2022-12-03 DIAGNOSIS — M7672 Peroneal tendinitis, left leg: Secondary | ICD-10-CM | POA: Diagnosis not present

## 2022-12-03 DIAGNOSIS — M25372 Other instability, left ankle: Secondary | ICD-10-CM | POA: Diagnosis not present

## 2022-12-09 ENCOUNTER — Other Ambulatory Visit: Payer: Self-pay | Admitting: Family

## 2023-01-06 DIAGNOSIS — M25561 Pain in right knee: Secondary | ICD-10-CM | POA: Diagnosis not present

## 2023-01-26 ENCOUNTER — Other Ambulatory Visit: Payer: Self-pay | Admitting: Family

## 2023-02-05 ENCOUNTER — Other Ambulatory Visit: Payer: Self-pay | Admitting: Family

## 2023-02-06 DIAGNOSIS — Z0000 Encounter for general adult medical examination without abnormal findings: Secondary | ICD-10-CM | POA: Diagnosis not present

## 2023-02-06 DIAGNOSIS — R635 Abnormal weight gain: Secondary | ICD-10-CM | POA: Diagnosis not present

## 2023-02-06 DIAGNOSIS — Z6832 Body mass index (BMI) 32.0-32.9, adult: Secondary | ICD-10-CM | POA: Diagnosis not present

## 2023-02-06 DIAGNOSIS — E66811 Obesity, class 1: Secondary | ICD-10-CM | POA: Diagnosis not present

## 2023-02-06 DIAGNOSIS — Z1330 Encounter for screening examination for mental health and behavioral disorders, unspecified: Secondary | ICD-10-CM | POA: Diagnosis not present

## 2023-02-06 DIAGNOSIS — J4540 Moderate persistent asthma, uncomplicated: Secondary | ICD-10-CM | POA: Diagnosis not present

## 2023-02-06 DIAGNOSIS — L819 Disorder of pigmentation, unspecified: Secondary | ICD-10-CM | POA: Diagnosis not present

## 2023-02-06 DIAGNOSIS — Z23 Encounter for immunization: Secondary | ICD-10-CM | POA: Diagnosis not present

## 2023-02-06 DIAGNOSIS — F324 Major depressive disorder, single episode, in partial remission: Secondary | ICD-10-CM | POA: Diagnosis not present

## 2023-02-06 DIAGNOSIS — Z13220 Encounter for screening for lipoid disorders: Secondary | ICD-10-CM | POA: Diagnosis not present

## 2023-02-20 DIAGNOSIS — M545 Low back pain, unspecified: Secondary | ICD-10-CM | POA: Diagnosis not present

## 2023-02-20 DIAGNOSIS — G894 Chronic pain syndrome: Secondary | ICD-10-CM | POA: Diagnosis not present

## 2023-02-20 DIAGNOSIS — Z79899 Other long term (current) drug therapy: Secondary | ICD-10-CM | POA: Diagnosis not present

## 2023-02-23 DIAGNOSIS — R635 Abnormal weight gain: Secondary | ICD-10-CM | POA: Diagnosis not present

## 2023-02-23 DIAGNOSIS — Z1331 Encounter for screening for depression: Secondary | ICD-10-CM | POA: Diagnosis not present

## 2023-02-23 DIAGNOSIS — N926 Irregular menstruation, unspecified: Secondary | ICD-10-CM | POA: Diagnosis not present

## 2023-02-23 DIAGNOSIS — Z780 Asymptomatic menopausal state: Secondary | ICD-10-CM | POA: Diagnosis not present

## 2023-03-11 ENCOUNTER — Other Ambulatory Visit: Payer: Self-pay | Admitting: Family

## 2023-07-14 ENCOUNTER — Other Ambulatory Visit: Payer: Self-pay | Admitting: Family

## 2023-07-20 ENCOUNTER — Telehealth: Payer: Self-pay | Admitting: Family

## 2023-07-20 NOTE — Telephone Encounter (Signed)
 I called patient and left a message to call the office back. Patient is due for an ov. She was last seen 07/15/2022 and was supposed to follow up 4-6 months after.

## 2023-07-20 NOTE — Telephone Encounter (Signed)
 Patient called and stated that her Symbacort inhaler was called into [facility] but Patient states that it is cheaper to get this inhaler at [facility]. [facility] is [facility] [address] in [place]. Patient has been out of this medication for 3 days. Patient is also wanting to talk about an alternative,less expensive medication she can use instead after this prescription. Patient insurance is [employer]. Patients call back number is [phone].

## 2023-07-24 MED ORDER — BUDESONIDE-FORMOTEROL FUMARATE 160-4.5 MCG/ACT IN AERO: INHALATION_SPRAY | RESPIRATORY_TRACT | 0 refills | Status: AC

## 2023-07-24 NOTE — Telephone Encounter (Signed)
 Patient called the office back and informed symbicort has been sent in.

## 2023-07-24 NOTE — Telephone Encounter (Signed)
 I called patient and left a message to callback to schedule an appt to get refills.

## 2023-07-24 NOTE — Telephone Encounter (Signed)
Patient has scheduled a follow up.

## 2023-07-24 NOTE — Addendum Note (Signed)
 Addended by: Elsworth Soho on: 07/24/2023 04:45 PM   Modules accepted: Orders

## 2023-07-24 NOTE — Telephone Encounter (Signed)
I called patient and left a vm to call the office back.

## 2023-08-02 NOTE — Progress Notes (Deleted)
 Follow Up Note  RE: Cassandra Baxter MRN: [id number] DOB: [birth year] Date of Office Visit: 08/03/2023  Referring provider: No ref. provider found Primary care provider: Ileana Ladd, MD (Inactive)  Chief Complaint: No chief complaint on file.  History of Present Illness: I had the pleasure of seeing Cassandra Baxter for a follow up visit at the [facility] on 08/02/2023. She is a 43 y.o. female, who is being followed for asthma, allergic rhinoconjunctivitis and reflux. Her previous allergy office visit was on 07/15/2022 with Nehemiah Settle FNP. Today is a regular follow up visit.  Discussed the use of AI scribe software for clinical note transcription with the patient, who gave verbal consent to proceed.  History of Present Illness            ***  Assessment and Plan: Cassandra Baxter is a 43 y.o. female with: Asthma: Daily controller medication(s): Continue Symbicort 2 puffs twice a day with spacer and rinse mouth afterwards.Refill sent May use levoalbuterol rescue inhaler 2 puffs every 4 to 6 hours as needed for shortness of breath, chest tightness, coughing, and wheezing. May use levoalbuterol rescue inhaler 2 puffs 5 to 15 minutes prior to strenuous physical activities. Monitor frequency of use.  The levoalbuterol rescue inhaler should not make you have heart palpitations.  Asthma control goals:  Full participation in all desired activities (may need albuterol before activity) Albuterol use two times or less a week on average (not counting use with activity) Cough interfering with sleep two times or less a month Oral steroids no more than once a year No hospitalizations   Allergic rhinoconjunctivitis: Past history - 09/13/2018 skin testing was positive to grass, weeds, ragweed, tree, mold, dust mites, cat, dog and horse.  Past skin testing was positive to grass, weeds, ragweed, tree, mold, dust mites, cat, dog and horse. Continue environmental control  measures.  May use over the counter antihistamines such as Xyzal (levocetirizine) daily as needed. May use Flonase (fluticasone) nasal spray 1 spray per nostril twice a day as needed for nasal congestion.  Nasal saline spray (i.e., Simply Saline) or nasal saline lavage (i.e., NeilMed) is recommended as needed and prior to medicated nasal sprays. Consider allergy injections in the future once breathing under better control. Start Pataday (olopatadine) 1 drop in each eye once a day as needed for itchy watery eyes     Reflux: Continue lifestyle and dietary modifications. Continue omeprazole 20mg  daily as needed - nothing to eat or drink for 30 minutes afterwards.    Your blood pressure was elevated in the office today. Recommend that you schedule an appointment with your primary care physician Assessment and Plan              No follow-ups on file.  No orders of the defined types were placed in this encounter.  Lab Orders  No laboratory test(s) ordered today    Diagnostics: Spirometry:  Tracings reviewed. Her effort: {Blank single:19197::"Good reproducible efforts.","It was hard to get consistent efforts and there is a question as to whether this reflects a maximal maneuver.","Poor effort, data can not be interpreted."} FVC: ***L FEV1: ***L, ***% predicted FEV1/FVC ratio: ***% Interpretation: {Blank single:19197::"Spirometry consistent with mild obstructive disease","Spirometry consistent with moderate obstructive disease","Spirometry consistent with severe obstructive disease","Spirometry consistent with possible restrictive disease","Spirometry consistent with mixed obstructive and restrictive disease","Spirometry uninterpretable due to technique","Spirometry consistent with normal pattern","No overt abnormalities noted given today's efforts"}.  Please see scanned spirometry results for details.  Skin Testing: {  Blank single:19197::"Select foods","Environmental allergy  panel","Environmental allergy panel and select foods","Food allergy panel","None","Deferred due to recent antihistamines use"}. *** Results discussed with patient/family.   Medication List:  Current Outpatient Medications  Medication Sig Dispense Refill   rifaximin (XIFAXAN) 550 MG TABS tablet Take 1 tablet (550 mg total) by mouth 3 (three) times daily. (Patient not taking: Reported on 07/15/2022) 42 tablet 0   acetaminophen (TYLENOL) 500 MG tablet Take 500 mg by mouth every 6 (six) hours as needed.     amphetamine-dextroamphetamine (ADDERALL) 30 MG tablet Take 1 tablet by mouth 2 (two) times daily. (Patient not taking: Reported on 07/15/2022)     budesonide-formoterol (SYMBICORT) 160-4.5 MCG/ACT inhaler INHALE 2 PUFFS INTO THE LUNGS 2 (TWO) TIMES DAILY. IN THE MORNING AND AT BEDTIME. 10.2 g 0   clonazePAM (KLONOPIN) 1 MG tablet TAKE 1 TABLET BY MOUTH 4 TIMES A DAY AS NEEDED     EPINEPHrine 0.3 mg/0.3 mL IJ SOAJ injection      fluticasone (FLONASE) 50 MCG/ACT nasal spray PLACE 1 SPRAY INTO BOTH NOSTRILS 2 (TWO) TIMES DAILY AS NEEDED FOR NASAL CONGESTION 16 g 5   gabapentin (NEURONTIN) 300 MG capsule TAKE 2 CAPSULES BY MOUTH 2 3 TIMES A DAY     HAILEY 24 FE 1-20 MG-MCG(24) tablet Take 1 tablet by mouth daily. (Patient not taking: Reported on 07/15/2022)     levalbuterol (XOPENEX HFA) 45 MCG/ACT inhaler PLEASE SEE ATTACHED FOR DETAILED DIRECTIONS 15 each 1   levocetirizine (XYZAL) 5 MG tablet TAKE 1 TABLET BY MOUTH EVERY DAY IN THE EVENING 90 tablet 1   meloxicam (MOBIC) 7.5 MG tablet Take 7.5 mg by mouth 2 (two) times daily.     Na Sulfate-K Sulfate-Mg Sulf (SUPREP BOWEL PREP KIT) 17.5-3.13-1.6 GM/177ML SOLN Take 1 kit by mouth as directed. For colonoscopy prep (Patient not taking: Reported on 07/15/2022) 354 mL 0   Olopatadine HCl 0.2 % SOLN Place 1 drop in each eye once a day as needed for itchy watery eyes 2.5 mL 5   orphenadrine (NORFLEX) 100 MG tablet TAKE 1 TABLET BY MOUTH TWICE A DAY IN THE  MORNING AND EVENING AS NEEDED     prazosin (MINIPRESS) 1 MG capsule      sertraline (ZOLOFT) 100 MG tablet Take 200 mg by mouth daily.     Spacer/Aero-Holding Chambers (AEROCHAMBER PLUS) inhaler Use as instructed 1 each 2   Spacer/Aero-Holding Rudean Curt Use as directed with your inhaler 1 each 0   traMADol (ULTRAM) 50 MG tablet Take 50 mg by mouth daily as needed (pain).      traZODone (DESYREL) 50 MG tablet TAKE 1 TO 3 TABLETS BY MOUTH AT BEDTIME     No current facility-administered medications for this visit.   Allergies: No Known Allergies I reviewed her past medical history, social history, family history, and environmental history and no significant changes have been reported from her previous visit.  Review of Systems  Constitutional:  Negative for appetite change, chills, fever and unexpected weight change.  HENT:  Negative for congestion and rhinorrhea.   Eyes:  Negative for itching.  Respiratory:  Negative for cough, chest tightness, shortness of breath and wheezing.   Cardiovascular:  Negative for chest pain.  Gastrointestinal:  Negative for abdominal pain.  Genitourinary:  Negative for difficulty urinating.  Skin:  Negative for rash.  Allergic/Immunologic: Positive for environmental allergies.  Neurological:  Negative for headaches.    Objective: There were no vitals taken for this visit. There is no height  or weight on file to calculate BMI. Physical Exam Vitals and nursing note reviewed.  Constitutional:      Appearance: Normal appearance. She is well-developed.  HENT:     Head: Normocephalic and atraumatic.     Right Ear: Tympanic membrane and external ear normal.     Left Ear: Tympanic membrane and external ear normal.     Nose: Nose normal.     Mouth/Throat:     Mouth: Mucous membranes are moist.     Pharynx: Oropharynx is clear.  Eyes:     Conjunctiva/sclera: Conjunctivae normal.  Cardiovascular:     Rate and Rhythm: Normal rate and regular rhythm.      Heart sounds: Normal heart sounds. No murmur heard.    No friction rub. No gallop.  Pulmonary:     Effort: Pulmonary effort is normal.     Breath sounds: Normal breath sounds. No wheezing, rhonchi or rales.  Musculoskeletal:     Cervical back: Neck supple.  Skin:    General: Skin is warm.     Findings: No rash.  Neurological:     Mental Status: She is alert and oriented to person, place, and time.  Psychiatric:        Behavior: Behavior normal.    Previous notes and tests were reviewed. The plan was reviewed with the patient/family, and all questions/concerned were addressed.  It was my pleasure to see Cassandra Baxter today and participate in her care. Please feel free to contact me with any questions or concerns.  Sincerely,  Wyline Mood, DO Allergy & Immunology  [facility] office: [phone] [facility] office: [phone]

## 2023-08-03 ENCOUNTER — Ambulatory Visit: Admitting: Allergy

## 2023-08-03 DIAGNOSIS — J309 Allergic rhinitis, unspecified: Secondary | ICD-10-CM

## 2023-08-03 DIAGNOSIS — K219 Gastro-esophageal reflux disease without esophagitis: Secondary | ICD-10-CM

## 2023-08-03 DIAGNOSIS — H1010 Acute atopic conjunctivitis, unspecified eye: Secondary | ICD-10-CM
